# Patient Record
Sex: Male | Born: 1975
Health system: Southern US, Community
[De-identification: ages and names within clinical notes are randomized; demographics above are authoritative.]

## PROBLEM LIST (undated history)

## (undated) DIAGNOSIS — R55 Syncope and collapse: Secondary | ICD-10-CM

## (undated) HISTORY — DX: Syncope and collapse: R55

---

## 2010-11-10 ENCOUNTER — Other Ambulatory Visit: Payer: Self-pay | Admitting: Family Medicine

## 2010-11-10 DIAGNOSIS — N509 Disorder of male genital organs, unspecified: Secondary | ICD-10-CM

## 2010-11-11 ENCOUNTER — Ambulatory Visit
Admission: RE | Admit: 2010-11-11 | Discharge: 2010-11-11 | Disposition: A | Discharge status: Home / Self Care | Payer: BLUE CROSS/BLUE SHIELD | Source: Ambulatory Visit | Attending: Family Medicine | Admitting: Family Medicine

## 2010-11-11 ENCOUNTER — Other Ambulatory Visit: Payer: Self-pay | Admitting: Family Medicine

## 2010-11-11 DIAGNOSIS — N509 Disorder of male genital organs, unspecified: Secondary | ICD-10-CM

## 2012-02-23 ENCOUNTER — Ambulatory Visit (INDEPENDENT_AMBULATORY_CARE_PROVIDER_SITE_OTHER): Payer: BC Managed Care – PPO | Admitting: Family Medicine

## 2012-02-23 VITALS — BP 122/82 | HR 70 | Temp 98.3°F | Resp 16 | Ht 69.5 in | Wt 185.0 lb

## 2012-02-23 DIAGNOSIS — L255 Unspecified contact dermatitis due to plants, except food: Secondary | ICD-10-CM

## 2012-02-23 DIAGNOSIS — L237 Allergic contact dermatitis due to plants, except food: Secondary | ICD-10-CM

## 2012-02-23 MED ORDER — METHYLPREDNISOLONE ACETATE 80 MG/ML IJ SUSP
120.0000 mg | Freq: Once | INTRAMUSCULAR | Status: AC
  Administered 2012-02-23: 120 mg via INTRAMUSCULAR

## 2012-02-23 NOTE — Progress Notes (Signed)
[facility] [address], [place] [address] [phone]  Date:  02/23/2012   Name:  Roger York   DOB:  1975-08-29   MRN:  [id number]  PCP:  No primary provider on file.    Chief Complaint: Poison Ivy   History of Present Illness:  Roger York is a 36 y.o. very pleasant male patient who presents with the following:  He was doing some work outside on Saturday (today is Thursday)- he broke out in PI on his right arm.  He has tried some OTC spray.  He usually gets a shot of steroids when this happens and it gets rid of the problem.  He tends to have severe PI.  He otherwise feels well and is generally healthy.   There is no problem list on file for this patient.   No past medical history on file.  No past surgical history on file.  History  Substance Use Topics  . Smoking status: Never Smoker   . Smokeless tobacco: Not on file  . Alcohol Use: Not on file    No family history on file.  Allergies  Allergen Reactions  . Penicillins Rash    Medication list has been reviewed and updated.  No current outpatient prescriptions on file prior to visit.    Review of Systems:  As per HPI- otherwise negative.   Physical Examination: Filed Vitals:   02/23/12 1357  BP: 122/82  Pulse: 70  Temp: 98.3 F (36.8 C)  Resp: 16   Filed Vitals:   02/23/12 1357  Height: 5' 9.5" (1.765 m)  Weight: 185 lb (83.915 kg)   Body mass index is 26.93 kg/(m^2). Ideal Body Weight: Weight in (lb) to have BMI = 25: 171.4   GEN: WDWN, NAD, Non-toxic, A & O x 3 HEENT: Atraumatic, Normocephalic. Neck supple. No masses, No LAD. Ears and Nose: No external deformity. CV: RRR, No M/G/R. No JVD. No thrill. No extra heart sounds. PULM: CTA B, no wheezes, crackles, rhonchi. No retractions. No resp. distress. No accessory muscle use EXTR: No c/c/e NEURO Normal gait.  PSYCH: Normally interactive. Conversant. Not depressed or anxious appearing.  Calm demeanor.    Right forearm with severe rhus dermatitis and several intact small bullae.  There are a few scattered minor area of rhus derm on his left arm and right ankle  Assessment and Plan: 1. Poison ivy  methylPREDNISolone acetate (DEPO-MEDROL) injection 120 mg   Demetrion prefers IM steroid shot- will treat as above.   Let me know if not better-Sooner if worse.     Abbe Amsterdam, MD

## 2014-04-20 ENCOUNTER — Ambulatory Visit (INDEPENDENT_AMBULATORY_CARE_PROVIDER_SITE_OTHER): Payer: BC Managed Care – PPO | Admitting: Physician Assistant

## 2014-04-20 VITALS — BP 110/66 | HR 67 | Temp 98.0°F | Resp 16 | Ht 69.0 in | Wt 167.4 lb

## 2014-04-20 DIAGNOSIS — J329 Chronic sinusitis, unspecified: Secondary | ICD-10-CM

## 2014-04-20 MED ORDER — AZITHROMYCIN 250 MG PO TABS: ORAL_TABLET | ORAL | Status: DC

## 2014-04-20 MED ORDER — IPRATROPIUM BROMIDE 0.03 % NA SOLN: 2.0000 | Freq: Two times a day (BID) | NASAL | Status: DC

## 2014-04-20 MED ORDER — GUAIFENESIN ER 1200 MG PO TB12: 1.0000 | ORAL_TABLET | Freq: Two times a day (BID) | ORAL | Status: DC | PRN

## 2014-04-20 NOTE — Progress Notes (Signed)
Subjective:    Patient ID: Roger York, male    DOB: 1976/05/20, 38 y.o.   MRN: [id number]   PCP: No PCP Per Patient  Chief Complaint  Patient presents with  . Sinusitis    x 1 week    Medications, allergies, past medical history, surgical history, family history, social history and problem list reviewed and updated.  HPI  This 38 y.o. male presents for evaluation of sinus symptoms x 7+ days.  Symptoms began while he was in [place] for work.  He took some over-the-counter products there, but he's not really sure what they were.  He arrived home yesterday and feels worse. Facial pressure and pain, congestion, drainage, cough is non-productive.  No fever, chills, arthralgias, myalgias, rash. No GI/GU symptoms.    Review of Systems As above.    Objective:   Physical Exam  Vitals reviewed. Constitutional: He is oriented to person, place, and time. Vital signs are normal. He appears well-developed and well-nourished. He is active and cooperative. No distress.  BP 110/66  Pulse 67  Temp(Src) 98 F (36.7 C) (Oral)  Resp 16  Ht  (1.753 m)  Wt 167 lb 6 oz (75.921 kg)  BMI 24.71 kg/m2  SpO2 99%   HENT:  Head: Normocephalic and atraumatic.  Right Ear: Hearing, tympanic membrane, external ear and ear canal normal.  Left Ear: Hearing, external ear and ear canal normal. Tympanic membrane is injected. Tympanic membrane is not retracted and not bulging.  Nose: Mucosal edema and rhinorrhea present.  No foreign bodies. Right sinus exhibits no maxillary sinus tenderness and no frontal sinus tenderness. Left sinus exhibits no maxillary sinus tenderness and no frontal sinus tenderness.  Mouth/Throat: Uvula is midline, oropharynx is clear and moist and mucous membranes are normal. No uvula swelling. No oropharyngeal exudate.  Eyes: Conjunctivae, EOM and lids are normal. Pupils are equal, round, and reactive to light. Right eye exhibits no discharge. Left eye  exhibits no discharge. No scleral icterus.  Neck: Trachea normal, normal range of motion and full passive range of motion without pain. Neck supple. No mass and no thyromegaly present.  Cardiovascular: Normal rate, regular rhythm and normal heart sounds.   Pulmonary/Chest: Effort normal and breath sounds normal.  Lymphadenopathy:       Head (right side): No submandibular, no tonsillar, no preauricular, no posterior auricular and no occipital adenopathy present.       Head (left side): No submandibular, no tonsillar, no preauricular and no occipital adenopathy present.    He has no cervical adenopathy.       Right: No supraclavicular adenopathy present.       Left: No supraclavicular adenopathy present.  Neurological: He is alert and oriented to person, place, and time. He has normal strength. No cranial nerve deficit or sensory deficit.  Skin: Skin is warm, dry and intact. No rash noted.  Psychiatric: He has a normal mood and affect. His speech is normal and behavior is normal.          Assessment & Plan:  1. Sinusitis, unspecified chronicity, unspecified location Anticipatory guidance, supportive care. RTC if symptoms worsen/persist. - ipratropium (ATROVENT) 0.03 % nasal spray; Place 2 sprays into both nostrils 2 (two) times daily.  Dispense: 30 mL; Refill: 0 - Guaifenesin (MUCINEX MAXIMUM STRENGTH) 1200 MG TB12; Take 1 tablet (1,200 mg total) by mouth every 12 (twelve) hours as needed.  Dispense: 14 tablet; Refill: 1 - azithromycin (ZITHROMAX) 250 MG tablet; Take 2  tabs PO x 1 dose, then 1 tab PO QD x 4 days  Dispense: 6 tablet; Refill: 0   Fernande Bras, PA-C Physician Assistant-Certified [facility]

## 2014-04-20 NOTE — Patient Instructions (Signed)
Get plenty of rest and drink at least 64 ounces of water daily. 

## 2014-06-18 ENCOUNTER — Ambulatory Visit (INDEPENDENT_AMBULATORY_CARE_PROVIDER_SITE_OTHER): Payer: BC Managed Care – PPO | Admitting: Family Medicine

## 2014-06-18 ENCOUNTER — Ambulatory Visit (INDEPENDENT_AMBULATORY_CARE_PROVIDER_SITE_OTHER): Payer: BC Managed Care – PPO

## 2014-06-18 VITALS — BP 114/68 | HR 89 | Temp 99.6°F | Resp 18 | Ht 69.0 in | Wt 166.4 lb

## 2014-06-18 DIAGNOSIS — R05 Cough: Secondary | ICD-10-CM

## 2014-06-18 DIAGNOSIS — R509 Fever, unspecified: Secondary | ICD-10-CM

## 2014-06-18 DIAGNOSIS — M791 Myalgia, unspecified site: Secondary | ICD-10-CM

## 2014-06-18 DIAGNOSIS — R55 Syncope and collapse: Secondary | ICD-10-CM

## 2014-06-18 DIAGNOSIS — R059 Cough, unspecified: Secondary | ICD-10-CM

## 2014-06-18 DIAGNOSIS — J159 Unspecified bacterial pneumonia: Secondary | ICD-10-CM

## 2014-06-18 LAB — POCT INFLUENZA A/B
INFLUENZA A, POC: NEGATIVE
Influenza B, POC: NEGATIVE

## 2014-06-18 LAB — CBC
HCT: 44.7 % (ref 39.0–52.0)
Hemoglobin: 15.6 g/dL (ref 13.0–17.0)
MCH: 29.1 pg (ref 26.0–34.0)
MCHC: 34.9 g/dL (ref 30.0–36.0)
MCV: 83.2 fL (ref 78.0–100.0)
MPV: 9 fL — AB (ref 9.4–12.4)
Platelets: 132 10*3/uL — ABNORMAL LOW (ref 150–400)
RBC: 5.37 MIL/uL (ref 4.22–5.81)
RDW: 14.1 % (ref 11.5–15.5)
WBC: 8 10*3/uL (ref 4.0–10.5)

## 2014-06-18 MED ORDER — LEVOFLOXACIN 500 MG PO TABS: 500.0000 mg | ORAL_TABLET | Freq: Every day | ORAL | Status: DC

## 2014-06-18 NOTE — Progress Notes (Addendum)
Subjective:   Patient ID: Roger York, male    DOB: [birth year], 38 y.o.   MRN: [id number]   This chart was scribed for Trinna Post, MD by Milly Jakob, ED Scribe. The patient was seen in room 5. Patient's care was started at 3:10 PM.   HPI  HPI Comments: Roger York is a 38 y.o. male who [facility] complaining of fever, chills, cough, generalized body aches, and chest congestion onset 2 days ago, and acutely worsened last night after dinner. He reports a subjective fever which began 2 days ago, and a fever yesterday of 102.5 and max temp today of 102.7. He reports chills and shaking from the chills. He states that the worse part has been the generalized body aches. He reports taking Advil and Tylenol with some relief. He states that this morning he experienced a near syncopal event in the shower, but he was able to sit down and avoid loosing consciousness. He denies sore throat, dysuria, or history of bladder infections. He reports drinking fluids all day and urinating 3 times.   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 He denies being around any sick individuals. He did not have a flu shot this year. He denies any recent forigen travel, but reports that he visited [place] for work in September. He denies a history of anemia or other medical problems. He denies taking any regular medications.  He reports that his daughter had a mild case of pneumonia 1 month ago.   There are no active problems to display for this patient.  History reviewed. No pertinent past medical history. History reviewed. No pertinent past surgical history. Allergies  Allergen Reactions  . Penicillins Rash   Prior to Admission medications   Medication Sig Start Date End Date Taking? Authorizing Provider  acetaminophen (TYLENOL) 500 MG tablet Take 500 mg by mouth every 6 (six) hours as needed.   Yes Historical Provider, MD  ibuprofen  (ADVIL,MOTRIN) 200 MG tablet Take 200 mg by mouth every 6 (six) hours as needed.   Yes Historical Provider, MD  ipratropium (ATROVENT) 0.03 % nasal spray Place 2 sprays into both nostrils 2 (two) times daily. 04/20/14   Fernande Bras, PA-C   History   Social History  . Marital Status: Married    Spouse Name: Revonda Standard    Number of Children: 0  . Years of Education: N/A   Occupational History  . [employer]    Social History Main Topics  . Smoking status: Never Smoker   . Smokeless tobacco: Never Used  . Alcohol Use: 4.0 oz/week    8 drink(s) per week  . Drug Use: No  . Sexual Activity: Not on file   Other Topics Concern  . Not on file   Social History Narrative   Lives with his wife and their 3 children.   Review of Systems  Constitutional: Positive for fever, chills, diaphoresis and fatigue.  HENT: Positive for congestion. Negative for sore throat.   Respiratory: Positive for cough.    Objective:  Physical Exam  Constitutional: He is oriented to person, place, and time. He appears well-developed and well-nourished. No distress.  HENT:  Head: Normocephalic and atraumatic.  Eyes: Conjunctivae and EOM are normal. Pupils are equal, round, and reactive to light.  Neck: Neck supple. No tracheal deviation present.  Cardiovascular: Normal rate, regular rhythm and normal heart sounds.   No murmur heard.  Pulmonary/Chest: Effort normal. No respiratory distress. He has rhonchi (left lower lobe).  Abdominal: Soft. There is no tenderness.  Musculoskeletal: Normal range of motion.  Neurological: He is alert and oriented to person, place, and time.  Skin: Skin is warm. He is diaphoretic.  Psychiatric: He has a normal mood and affect. His behavior is normal.  Nursing note and vitals reviewed.  Filed Vitals:   06/18/14 1427  BP: 114/68  Pulse: 89  Temp: 99.6 F (37.6 C)  Resp: 18   Results for orders placed or performed in visit on 06/18/14  POCT Influenza A/B  Result Value Ref  Range   Influenza A, POC Negative    Influenza B, POC Negative     [facility] reading (PRIMARY) by  Dr. Neva SeatGreene: CXR: RLL infiltrate, possible LLL infiltrate - seen retrocardiac on lateral. .  CBC sent out stat as in house CBC out of order in office: Results for orders placed or performed in visit on 06/18/14  CBC  Result Value Ref Range   WBC 8.0 4.0 - 10.5 K/uL   RBC 5.37 4.22 - 5.81 MIL/uL   Hemoglobin 15.6 13.0 - 17.0 g/dL   HCT 16.144.7 09.639.0 - 04.552.0 %   MCV 83.2 78.0 - 100.0 fL   MCH 29.1 26.0 - 34.0 pg   MCHC 34.9 30.0 - 36.0 g/dL   RDW 40.914.1 81.111.5 - 91.415.5 %   Platelets 132 (L) 150 - 400 K/uL   MPV 9.0 (L) 9.4 - 12.4 fL  POCT Influenza A/B  Result Value Ref Range   Influenza A, POC Negative    Influenza B, POC Negative     Assessment & Plan:   Roger York is a 38 y.o. male Fever, unspecified fever cause , Cough, Myalgia - Plan: DG Chest 2 View, POCT Influenza A/B, CBC, CANCELED: POCT CBC  Pneumonia, bacterial - Plan: levofloxacin (LEVAQUIN) 500 MG tablet  Vasovagal near syncope  Concern for early CAP,  O2 sat ok, not in resp distress.  -started on Levaquin, antipyreteics for fever, rtc/er precautions and sx care. Plan for repeat eval in 2 days. Sooner if worse. Understanding expressed.   Meds ordered this encounter  Medications  . acetaminophen (TYLENOL) 500 MG tablet    Sig: Take 500 mg by mouth every 6 (six) hours as needed.  Marland Kitchen. ibuprofen (ADVIL,MOTRIN) 200 MG tablet    Sig: Take 200 mg by mouth every 6 (six) hours as needed.  Marland Kitchen. levofloxacin (LEVAQUIN) 500 MG tablet    Sig: Take 1 tablet (500 mg total) by mouth daily.    Dispense:  10 tablet    Refill:  0   Patient Instructions  Start Levaquin for pneumonia, recheck in next 2 days (Dr. Dareen PianoAnderson or Dr. Alwyn RenHopper). Increase fluids, small amounts frequently. If any further lighthheadedness or feeling of going to pass out - return here or emergency room.  Return to the clinic or go to the nearest emergency room if any of  your symptoms worsen or new symptoms occur.  Pneumonia Pneumonia is an infection of the lungs.  CAUSES Pneumonia may be caused by bacteria or a virus. Usually, these infections are caused by breathing infectious particles into the lungs (respiratory tract). SIGNS AND SYMPTOMS   Cough.  Fever.  Chest pain.  Increased rate of breathing.  Wheezing.  Mucus production. DIAGNOSIS  If you have the common symptoms of pneumonia, your health care provider will typically confirm the diagnosis with a chest X-ray. The X-ray will show an abnormality  in the lung (pulmonary infiltrate) if you have pneumonia. Other tests of your blood, urine, or sputum may be done to find the specific cause of your pneumonia. Your health care provider may also do tests (blood gases or pulse oximetry) to see how well your lungs are working. TREATMENT  Some forms of pneumonia may be spread to other people when you cough or sneeze. You may be asked to wear a mask before and during your exam. Pneumonia that is caused by bacteria is treated with antibiotic medicine. Pneumonia that is caused by the influenza virus may be treated with an antiviral medicine. Most other viral infections must run their course. These infections will not respond to antibiotics.  HOME CARE INSTRUCTIONS   Cough suppressants may be used if you are losing too much rest. However, coughing protects you by clearing your lungs. You should avoid using cough suppressants if you can.  Your health care provider may have prescribed medicine if he or she thinks your pneumonia is caused by bacteria or influenza. Finish your medicine even if you start to feel better.  Your health care provider may also prescribe an expectorant. This loosens the mucus to be coughed up.  Take medicines only as directed by your health care provider.  Do not smoke. Smoking is a common cause of bronchitis and can contribute to pneumonia. If you are a smoker and continue to smoke,  your cough may last several weeks after your pneumonia has cleared.  A cold steam vaporizer or humidifier in your room or home may help loosen mucus.  Coughing is often worse at night. Sleeping in a semi-upright position in a recliner or using a couple pillows under your head will help with this.  Get rest as you feel it is needed. Your body will usually let you know when you need to rest. PREVENTION A pneumococcal shot (vaccine) is available to prevent a common bacterial cause of pneumonia. This is usually suggested for:  People over 44 years old.  Patients on chemotherapy.  People with chronic lung problems, such as bronchitis or emphysema.  People with immune system problems. If you are over 65 or have a high risk condition, you may receive the pneumococcal vaccine if you have not received it before. In some countries, a routine influenza vaccine is also recommended. This vaccine can help prevent some cases of pneumonia.You may be offered the influenza vaccine as part of your care. If you smoke, it is time to quit. You may receive instructions on how to stop smoking. Your health care provider can provide medicines and counseling to help you quit. SEEK MEDICAL CARE IF: You have a fever. SEEK IMMEDIATE MEDICAL CARE IF:   Your illness becomes worse. This is especially true if you are elderly or weakened from any other disease.  You cannot control your cough with suppressants and are losing sleep.  You begin coughing up blood.  You develop pain which is getting worse or is uncontrolled with medicines.  Any of the symptoms which initially brought you in for treatment are getting worse rather than better.  You develop shortness of breath or chest pain. MAKE SURE YOU:   Understand these instructions.  Will watch your condition.  Will get help right away if you are not doing well or get worse. Document Released: 07/18/2005 Document Revised: 12/02/2013 Document Reviewed:  10/07/2010 [facility] Patient Information 2015 [place], [place]. This information is not intended to replace advice given to you by your health care provider. Make  sure you discuss any questions you have with your health care provider.     I personally performed the services described in this documentation, which was scribed in my presence. The recorded information has been reviewed and considered, and addended by me as needed.

## 2014-06-18 NOTE — Patient Instructions (Addendum)
Start Levaquin for pneumonia, recheck in next 2 days (Dr. Dareen PianoAnderson or Dr. Alwyn RenHopper). Increase fluids, small amounts frequently. If any further lighthheadedness or feeling of going to pass out - return here or emergency room.  Return to the clinic or go to the nearest emergency room if any of your symptoms worsen or new symptoms occur.  Pneumonia Pneumonia is an infection of the lungs.  CAUSES Pneumonia may be caused by bacteria or a virus. Usually, these infections are caused by breathing infectious particles into the lungs (respiratory tract). SIGNS AND SYMPTOMS   Cough.  Fever.  Chest pain.  Increased rate of breathing.  Wheezing.  Mucus production. DIAGNOSIS  If you have the common symptoms of pneumonia, your health care provider will typically confirm the diagnosis with a chest X-ray. The X-ray will show an abnormality in the lung (pulmonary infiltrate) if you have pneumonia. Other tests of your blood, urine, or sputum may be done to find the specific cause of your pneumonia. Your health care provider may also do tests (blood gases or pulse oximetry) to see how well your lungs are working. TREATMENT  Some forms of pneumonia may be spread to other people when you cough or sneeze. You may be asked to wear a mask before and during your exam. Pneumonia that is caused by bacteria is treated with antibiotic medicine. Pneumonia that is caused by the influenza virus may be treated with an antiviral medicine. Most other viral infections must run their course. These infections will not respond to antibiotics.  HOME CARE INSTRUCTIONS   Cough suppressants may be used if you are losing too much rest. However, coughing protects you by clearing your lungs. You should avoid using cough suppressants if you can.  Your health care provider may have prescribed medicine if he or she thinks your pneumonia is caused by bacteria or influenza. Finish your medicine even if you start to feel better.  Your  health care provider may also prescribe an expectorant. This loosens the mucus to be coughed up.  Take medicines only as directed by your health care provider.  Do not smoke. Smoking is a common cause of bronchitis and can contribute to pneumonia. If you are a smoker and continue to smoke, your cough may last several weeks after your pneumonia has cleared.  A cold steam vaporizer or humidifier in your room or home may help loosen mucus.  Coughing is often worse at night. Sleeping in a semi-upright position in a recliner or using a couple pillows under your head will help with this.  Get rest as you feel it is needed. Your body will usually let you know when you need to rest. PREVENTION A pneumococcal shot (vaccine) is available to prevent a common bacterial cause of pneumonia. This is usually suggested for:  People over 40 years old.  Patients on chemotherapy.  People with chronic lung problems, such as bronchitis or emphysema.  People with immune system problems. If you are over 65 or have a high risk condition, you may receive the pneumococcal vaccine if you have not received it before. In some countries, a routine influenza vaccine is also recommended. This vaccine can help prevent some cases of pneumonia.You may be offered the influenza vaccine as part of your care. If you smoke, it is time to quit. You may receive instructions on how to stop smoking. Your health care provider can provide medicines and counseling to help you quit. SEEK MEDICAL CARE IF: You have a fever. SEEK IMMEDIATE  MEDICAL CARE IF:   Your illness becomes worse. This is especially true if you are elderly or weakened from any other disease.  You cannot control your cough with suppressants and are losing sleep.  You begin coughing up blood.  You develop pain which is getting worse or is uncontrolled with medicines.  Any of the symptoms which initially brought you in for treatment are getting worse rather than  better.  You develop shortness of breath or chest pain. MAKE SURE YOU:   Understand these instructions.  Will watch your condition.  Will get help right away if you are not doing well or get worse. Document Released: 07/18/2005 Document Revised: 12/02/2013 Document Reviewed: 10/07/2010 [facility] Patient Information 2015 [place]. This information is not intended to replace advice given to you by your health care provider. Make sure you discuss any questions you have with your health care provider.

## 2014-06-20 ENCOUNTER — Ambulatory Visit (INDEPENDENT_AMBULATORY_CARE_PROVIDER_SITE_OTHER): Payer: BC Managed Care – PPO | Admitting: Internal Medicine

## 2014-06-20 VITALS — BP 118/76 | HR 84 | Temp 98.6°F | Resp 18 | Ht 68.25 in | Wt 162.8 lb

## 2014-06-20 DIAGNOSIS — R5383 Other fatigue: Secondary | ICD-10-CM

## 2014-06-20 DIAGNOSIS — D696 Thrombocytopenia, unspecified: Secondary | ICD-10-CM

## 2014-06-20 DIAGNOSIS — J159 Unspecified bacterial pneumonia: Secondary | ICD-10-CM

## 2014-06-20 DIAGNOSIS — R51 Headache: Secondary | ICD-10-CM

## 2014-06-20 DIAGNOSIS — R519 Headache, unspecified: Secondary | ICD-10-CM

## 2014-06-20 LAB — POCT CBC
Granulocyte percent: 64.8 %G (ref 37–80)
HEMATOCRIT: 45.3 % (ref 43.5–53.7)
Hemoglobin: 15.2 g/dL (ref 14.1–18.1)
Lymph, poc: 1.2 (ref 0.6–3.4)
MCH, POC: 28.4 pg (ref 27–31.2)
MCHC: 33.5 g/dL (ref 31.8–35.4)
MCV: 84.8 fL (ref 80–97)
MID (cbc): 0.4 (ref 0–0.9)
MPV: 7 fL (ref 0–99.8)
POC Granulocyte: 2.9 (ref 2–6.9)
POC LYMPH %: 26.9 % (ref 10–50)
POC MID %: 8.3 %M (ref 0–12)
Platelet Count, POC: 139 10*3/uL — AB (ref 142–424)
RBC: 5.34 M/uL (ref 4.69–6.13)
RDW, POC: 13.8 %
WBC: 4.4 10*3/uL — AB (ref 4.6–10.2)

## 2014-06-20 NOTE — Progress Notes (Signed)
   Subjective:    Patient ID: Roger York, male    DOB: 10/05/1975, 38 y.o.   MRN: [id number]  HPI 38 year old Caucasian male is here today as a follow up for Pneumonia, diagnosed on 06/18/2014. He states that he feels much better. He states he is coughing up more phlegm than before and beginning to have more energy. He states he still has some dizziness. Appetite is slowly coming back. He states that last night after going to sleep, he did wake up soaked , did not take his temperature , so he does not know for sure if he was running a fever.   Review of Systems     Objective:   Physical Exam  Constitutional: He is oriented to person, place, and time. He appears well-developed and well-nourished. No distress.  HENT:  Head: Normocephalic.  Right Ear: External ear normal.  Left Ear: External ear normal.  Nose: Mucosal edema and rhinorrhea present.  Mouth/Throat: Oropharynx is clear and moist.  Eyes: Conjunctivae and EOM are normal. Pupils are equal, round, and reactive to light. No scleral icterus.  Neck: Normal range of motion. Neck supple.  Cardiovascular: Normal rate, regular rhythm and normal heart sounds.   Pulmonary/Chest: Effort normal and breath sounds normal. No respiratory distress. He has no wheezes. He has no rales. He exhibits tenderness.  Musculoskeletal: Normal range of motion.  Neurological: He is alert and oriented to person, place, and time. He has normal reflexes. No cranial nerve deficit. He exhibits normal muscle tone. Coordination normal.  Psychiatric: He has a normal mood and affect.     Results for orders placed or performed in visit on 06/20/14  POCT CBC  Result Value Ref Range   WBC 4.4 (A) 4.6 - 10.2 K/uL   Lymph, poc 1.2 0.6 - 3.4   POC LYMPH PERCENT 26.9 10 - 50 %L   MID (cbc) 0.4 0 - 0.9   POC MID % 8.3 0 - 12 %M   POC Granulocyte 2.9 2 - 6.9   Granulocyte percent 64.8 37 - 80 %G   RBC 5.34 4.69 - 6.13 M/uL   Hemoglobin 15.2 14.1 - 18.1 g/dL   HCT, POC 40.945.3 81.143.5 - 53.7 %   MCV 84.8 80 - 97 fL   MCH, POC 28.4 27 - 31.2 pg   MCHC 33.5 31.8 - 35.4 g/dL   RDW, POC 91.413.8 %   Platelet Count, POC 139 (A) 142 - 424 K/uL   MPV 7.0 0 - 99.8 fL        Assessment & Plan:

## 2014-06-20 NOTE — Patient Instructions (Signed)

## 2016-09-13 DIAGNOSIS — Z23 Encounter for immunization: Secondary | ICD-10-CM | POA: Diagnosis not present

## 2017-08-03 DIAGNOSIS — Z23 Encounter for immunization: Secondary | ICD-10-CM | POA: Diagnosis not present

## 2018-01-25 ENCOUNTER — Emergency Department (HOSPITAL_COMMUNITY)
Admission: EM | Admit: 2018-01-25 | Discharge: 2018-01-25 | Disposition: A | Discharge status: Home / Self Care | Payer: BLUE CROSS/BLUE SHIELD | Attending: Emergency Medicine | Admitting: Emergency Medicine

## 2018-01-25 ENCOUNTER — Emergency Department (HOSPITAL_COMMUNITY): Payer: BLUE CROSS/BLUE SHIELD

## 2018-01-25 ENCOUNTER — Encounter (HOSPITAL_COMMUNITY): Payer: Self-pay | Admitting: Emergency Medicine

## 2018-01-25 DIAGNOSIS — R531 Weakness: Secondary | ICD-10-CM | POA: Diagnosis not present

## 2018-01-25 DIAGNOSIS — H538 Other visual disturbances: Secondary | ICD-10-CM | POA: Insufficient documentation

## 2018-01-25 DIAGNOSIS — Z79899 Other long term (current) drug therapy: Secondary | ICD-10-CM | POA: Insufficient documentation

## 2018-01-25 DIAGNOSIS — R5383 Other fatigue: Secondary | ICD-10-CM | POA: Diagnosis not present

## 2018-01-25 DIAGNOSIS — R42 Dizziness and giddiness: Secondary | ICD-10-CM | POA: Insufficient documentation

## 2018-01-25 DIAGNOSIS — R51 Headache: Secondary | ICD-10-CM | POA: Diagnosis not present

## 2018-01-25 DIAGNOSIS — E860 Dehydration: Secondary | ICD-10-CM | POA: Diagnosis not present

## 2018-01-25 LAB — BASIC METABOLIC PANEL
ANION GAP: 8 (ref 5–15)
BUN: 13 mg/dL (ref 6–20)
CHLORIDE: 104 mmol/L (ref 98–111)
CO2: 28 mmol/L (ref 22–32)
CREATININE: 1.15 mg/dL (ref 0.61–1.24)
Calcium: 9.3 mg/dL (ref 8.9–10.3)
GFR calc non Af Amer: >60 mL/min (ref >60)
Glucose, Bld: 110 mg/dL — ABNORMAL HIGH (ref 70–99)
POTASSIUM: 4.3 mmol/L (ref 3.5–5.1)
SODIUM: 140 mmol/L (ref 135–145)

## 2018-01-25 LAB — CBC
HEMATOCRIT: 47.1 % (ref 39.0–52.0)
HEMOGLOBIN: 15.7 g/dL (ref 13.0–17.0)
MCH: 29.3 pg (ref 26.0–34.0)
MCHC: 33.3 g/dL (ref 30.0–36.0)
MCV: 88 fL (ref 78.0–100.0)
Platelets: 172 10*3/uL (ref 150–400)
RBC: 5.35 MIL/uL (ref 4.22–5.81)
RDW: 13 % (ref 11.5–15.5)
WBC: 4 10*3/uL (ref 4.0–10.5)

## 2018-01-25 LAB — TROPONIN I

## 2018-01-25 MED ORDER — SODIUM CHLORIDE 0.9 % IV BOLUS
1000.0000 mL | Freq: Once | INTRAVENOUS | Status: AC
  Administered 2018-01-25: 1000 mL via INTRAVENOUS

## 2018-01-25 NOTE — ED Notes (Signed)
Pt ambulated w/o difficulty

## 2018-01-25 NOTE — ED Provider Notes (Signed)
[facility]-EMERGENCY DEPT Provider Note   CSN: [id number] Arrival date & time: 01/25/18  0759     History   Chief Complaint Chief Complaint  Patient presents with  . Headache  . Fatigue  . Dizziness    HPI Roger York is a 42 y.o. male.  HPI  42 year old male with no significant past medical history here with lightheadedness and blurred vision.  Patient states he was in his usual state of health.  He went outside to play basketball with his son yesterday.  He states that after only 15 minutes of playing, began to feel lightheaded.  He had intermittent blurry vision that felt like a throbbing sensation along with a sensation that he was going to pass out.  He denies any chest pain or shortness of breath.  No nausea or vomiting.  No focal weakness or numbness.  He went inside and laid down and his symptoms gradually improved.  However, upon awakening this morning, he has had persistent intermittent blurred vision and a sensation of feeling very weak.  No nausea or vomiting.  No abdominal pain.  No vision changes. No CP since then. No med use. No OTC med use other than fish oil. Family does have a h/o early CAD in grandfather.   History reviewed. No pertinent past medical history.  There are no active problems to display for this patient.   History reviewed. No pertinent surgical history.      Home Medications    Prior to Admission medications   Medication Sig Start Date End Date Taking? Authorizing Provider  Omega-3 Fatty Acids (FISH OIL PO) Take 1 tablet by mouth daily.   Yes [provider]  ipratropium (ATROVENT) 0.03 % nasal spray Place 2 sprays into both nostrils 2 (two) times daily. Patient not taking: Reported on 01/25/2018 04/20/14   Porfirio OarJeffery, Chelle, PA-C  levofloxacin (LEVAQUIN) 500 MG tablet Take 1 tablet (500 mg total) by mouth daily. Patient not taking: Reported on 01/25/2018 06/18/14   Shade FloodGreene, Jeffrey R, MD    Family  History Family History  Problem Relation Age of Onset  . Muscular dystrophy Son     Social History Social History   Tobacco Use  . Smoking status: Never Smoker  . Smokeless tobacco: Never Used  Substance Use Topics  . Alcohol use: Yes    Alcohol/week: 4.8 oz    Types: 8 drink(s) per week  . Drug use: No     Allergies   Penicillins   Review of Systems Review of Systems  Constitutional: Positive for fatigue. Negative for chills and fever.  HENT: Negative for congestion and rhinorrhea.   Eyes: Negative for visual disturbance.  Respiratory: Negative for cough, shortness of breath and wheezing.   Cardiovascular: Negative for chest pain and leg swelling.  Gastrointestinal: Positive for nausea. Negative for abdominal pain, diarrhea and vomiting.  Genitourinary: Negative for dysuria and flank pain.  Musculoskeletal: Negative for neck pain and neck stiffness.  Skin: Negative for rash and wound.  Allergic/Immunologic: Negative for immunocompromised state.  Neurological: Positive for weakness and headaches. Negative for syncope.  All other systems reviewed and are negative.    Physical Exam Updated Vital Signs BP 123/79 (BP Location: Right Arm)   Pulse (!) 56   Temp 97.6 F (36.4 C) (Oral)   Resp 16   Ht 5\' 10"  (1.778 m)   Wt 81.6 kg (180 lb)   SpO2 99%   BMI 25.83 kg/m   Physical Exam  Constitutional:  He is oriented to person, place, and time. He appears well-developed and well-nourished. No distress.  HENT:  Head: Normocephalic and atraumatic.  Eyes: Conjunctivae are normal.  Neck: Neck supple.  Cardiovascular: Normal rate, regular rhythm and normal heart sounds. Exam reveals no friction rub.  No murmur heard. Pulmonary/Chest: Effort normal and breath sounds normal. No respiratory distress. He has no wheezes. He has no rales.  Abdominal: He exhibits no distension.  Musculoskeletal: He exhibits no edema.  Neurological: He is alert and oriented to person, place,  and time. He has normal strength. No cranial nerve deficit or sensory deficit. He exhibits normal muscle tone. GCS eye subscore is 4. GCS verbal subscore is 5. GCS motor subscore is 6.  Skin: Skin is warm. Capillary refill takes less than 2 seconds.  Psychiatric: He has a normal mood and affect.  Nursing note and vitals reviewed.    ED Treatments / Results  Labs (all labs ordered are listed, but only abnormal results are displayed) Labs Reviewed  BASIC METABOLIC PANEL - Abnormal; Notable for the following components:      Result Value   Glucose, Bld 110 (*)    All other components within normal limits  CBC  TROPONIN I    EKG EKG Interpretation  Date/Time:  Thursday January 25 2018 08:07:18 EDT Ventricular Rate:  58 PR Interval:    QRS Duration: 111 QT Interval:  389 QTC Calculation: 382 R Axis:   -46 Text Interpretation:  Sinus rhythm Incomplete RBBB and LAFB RSR' in V1 or V2, right VCD or RVH No old tracing to compare Reconfirmed by Shaune Pollack [phone]) on 01/25/2018 8:23:36 AM   Radiology Dg Chest 2 View  Result Date: 01/25/2018 CLINICAL DATA:  Headache. EXAM: CHEST - 2 VIEW COMPARISON:  June 18, 2014 FINDINGS: The heart size and mediastinal contours are within normal limits. Both lungs are clear. The visualized skeletal structures are unremarkable. IMPRESSION: No active cardiopulmonary disease. Electronically Signed   By: Gerome Sam III M.D   On: 01/25/2018 09:35   Ct Head Wo Contrast  Result Date: 01/25/2018 CLINICAL DATA:  Headache began last night while playing basketball with son, dizziness, nausea, weakness EXAM: CT HEAD WITHOUT CONTRAST TECHNIQUE: Contiguous axial images were obtained from the base of the skull through the vertex without intravenous contrast. COMPARISON:  None. FINDINGS: Brain: The ventricular system is normal in size and configuration and the septum is in a normal midline position. The fourth ventricle and basilar cisterns are unremarkable. No  hemorrhage, mass lesion, or acute infarction is seen. Vascular: No vascular abnormality is noted on this unenhanced study. Skull: On bone window images, no calvarial abnormality is seen. Sinuses/Orbits: The paranasal sinuses are well pneumatized. Other: None. IMPRESSION: Negative unenhanced CT of the brain. Electronically Signed   By: Dwyane Dee M.D.   On: 01/25/2018 10:08    Procedures Procedures (including critical care time)  Medications Ordered in ED Medications  sodium chloride 0.9 % bolus 1,000 mL (1,000 mLs Intravenous New Bag/Given 01/25/18 0900)     Initial Impression / Assessment and Plan / ED Course  I have reviewed the triage vital signs and the nursing notes.  Pertinent labs & imaging results that were available during my care of the patient were reviewed by me and considered in my medical decision making (see chart for details).    42 year old male here with transient episode of fatigue and transient blurred vision while playing basketball yesterday.  EKG here is nonischemic.  Troponin negative despite  symptoms there yesterday with no active chest pain I do not suspect ACS or ischemic etiology.  He is not tachycardic, hypoxic, and I see no signs of PE.  No arrhythmia noted on telemetry.  Electrolytes otherwise within normal limits.  He has a nonfocal neurological examination and CT head is negative.  Chest x-ray without cardiomegaly or other abnormality.  I suspect this was secondary to dehydration and heat exposure while playing basketball outside and greater than 90 degrees heat.  He is been given fluids and feels better here.  He is able to ambulate without difficulty.  She does report some symptoms that could be consistent with transient palpitations so will refer him back to his PCP for possible outpatient monitoring, but no apparent emergent pathology identified at this time.  Final Clinical Impressions(s) / ED Diagnoses   Final diagnoses:  Dehydration  Blurred vision    Other fatigue    ED Discharge Orders    None       Shaune Pollack, MD 01/25/18 1038

## 2018-01-25 NOTE — ED Triage Notes (Signed)
Pt reports felt fine all day yesterday until after dinner when he went outside to play basketball with son. Reports while playing got headache, vision got "pulsing", dizzy, nausea and very weak, so went inside. Reports this morning still having headache and fatigue, with little dizziness. Denies n/v today.

## 2018-01-25 NOTE — Discharge Instructions (Addendum)
As we discussed, drink at least 6 to 8 glasses of fluid daily.  For now, I would stick to G2 or other low sugar electrolyte replacement drink.  If the metallic taste continues, I would recommend at home water testing.  For your symptoms, try to avoid any excessive exercise or exertion until you see a primary care doctor.  Drink fluids as above.  You may benefit from an outpatient Holter monitor to evaluate for possible underlying arrhythmia.

## 2018-01-31 DIAGNOSIS — H538 Other visual disturbances: Secondary | ICD-10-CM | POA: Diagnosis not present

## 2018-01-31 DIAGNOSIS — R51 Headache: Secondary | ICD-10-CM | POA: Diagnosis not present

## 2018-01-31 DIAGNOSIS — R42 Dizziness and giddiness: Secondary | ICD-10-CM | POA: Diagnosis not present

## 2018-01-31 DIAGNOSIS — M6281 Muscle weakness (generalized): Secondary | ICD-10-CM | POA: Diagnosis not present

## 2018-02-09 ENCOUNTER — Other Ambulatory Visit: Payer: Self-pay | Admitting: Physician Assistant

## 2018-02-09 ENCOUNTER — Ambulatory Visit
Admission: RE | Admit: 2018-02-09 | Discharge: 2018-02-09 | Disposition: A | Discharge status: Home / Self Care | Payer: BLUE CROSS/BLUE SHIELD | Source: Ambulatory Visit | Attending: Physician Assistant | Admitting: Physician Assistant

## 2018-02-09 DIAGNOSIS — M542 Cervicalgia: Secondary | ICD-10-CM | POA: Diagnosis not present

## 2018-02-09 DIAGNOSIS — M50322 Other cervical disc degeneration at C5-C6 level: Secondary | ICD-10-CM | POA: Diagnosis not present

## 2018-03-12 DIAGNOSIS — Z113 Encounter for screening for infections with a predominantly sexual mode of transmission: Secondary | ICD-10-CM | POA: Diagnosis not present

## 2018-09-20 DIAGNOSIS — J111 Influenza due to unidentified influenza virus with other respiratory manifestations: Secondary | ICD-10-CM | POA: Diagnosis not present

## 2018-10-12 DIAGNOSIS — J0100 Acute maxillary sinusitis, unspecified: Secondary | ICD-10-CM | POA: Diagnosis not present

## 2019-04-12 ENCOUNTER — Other Ambulatory Visit: Payer: Self-pay | Admitting: Family Medicine

## 2019-04-12 ENCOUNTER — Ambulatory Visit
Admission: RE | Admit: 2019-04-12 | Discharge: 2019-04-12 | Disposition: A | Discharge status: Home / Self Care | Payer: BLUE CROSS/BLUE SHIELD | Source: Ambulatory Visit | Attending: Family Medicine | Admitting: Family Medicine

## 2019-04-12 ENCOUNTER — Other Ambulatory Visit: Payer: Self-pay

## 2019-04-12 DIAGNOSIS — M546 Pain in thoracic spine: Secondary | ICD-10-CM | POA: Diagnosis not present

## 2019-04-12 DIAGNOSIS — M79601 Pain in right arm: Secondary | ICD-10-CM | POA: Diagnosis not present

## 2019-04-29 DIAGNOSIS — M79601 Pain in right arm: Secondary | ICD-10-CM | POA: Diagnosis not present

## 2019-05-01 DIAGNOSIS — M67823 Other specified disorders of tendon, right elbow: Secondary | ICD-10-CM | POA: Diagnosis not present

## 2019-09-09 ENCOUNTER — Ambulatory Visit: Payer: Self-pay | Attending: Internal Medicine

## 2019-09-09 DIAGNOSIS — Z23 Encounter for immunization: Secondary | ICD-10-CM

## 2019-09-09 NOTE — Progress Notes (Signed)
   Covid-19 Vaccination Clinic  Name:  Roger York    MRN: [id number] DOB: 1975-10-27  09/09/2019  [name] was observed post Covid-19 immunization for 15 minutes without incidence. He was provided with Vaccine Information Sheet and instruction to access the V-Safe system.   [name] was instructed to call 911 with any severe reactions post vaccine: Marland Kitchen Difficulty breathing  . Swelling of your face and throat  . A fast heartbeat  . A bad rash all over your body  . Dizziness and weakness    Immunizations Administered    Name Date Dose VIS Date Route   Pfizer COVID-19 Vaccine 09/09/2019  6:37 PM 0.3 mL 07/12/2019 Intramuscular   Manufacturer: [employer], [employer]   Lot: [id number]   NDC: [address]-6

## 2019-10-05 ENCOUNTER — Ambulatory Visit: Payer: Self-pay | Attending: Internal Medicine

## 2019-10-05 DIAGNOSIS — Z23 Encounter for immunization: Secondary | ICD-10-CM | POA: Insufficient documentation

## 2019-10-05 NOTE — Progress Notes (Signed)
   Covid-19 Vaccination Clinic  Name:  Roger York    MRN: [id number] DOB: [birth year]  10/05/2019  [name] was observed post Covid-19 immunization for 15 minutes without incident. He was provided with Vaccine Information Sheet and instruction to access the V-Safe system.   [name] was instructed to call 911 with any severe reactions post vaccine: Marland Kitchen Difficulty breathing  . Swelling of face and throat  . A fast heartbeat  . A bad rash all over body  . Dizziness and weakness   Immunizations Administered    Name Date Dose VIS Date Route   Pfizer COVID-19 Vaccine 10/05/2019  8:55 AM 0.3 mL 07/12/2019 Intramuscular   Manufacturer: [employer], [employer]   Lot: [id number]   NDC: [address]-4

## 2021-03-02 IMAGING — CR DG THORACIC SPINE 3V
3 series · 3 of 3 positions shown · non-contrast
Comparison: None.

CLINICAL DATA: Thoracic spine pain

EXAM:
THORACIC SPINE - 3 VIEWS

[t thoracic spine ap]
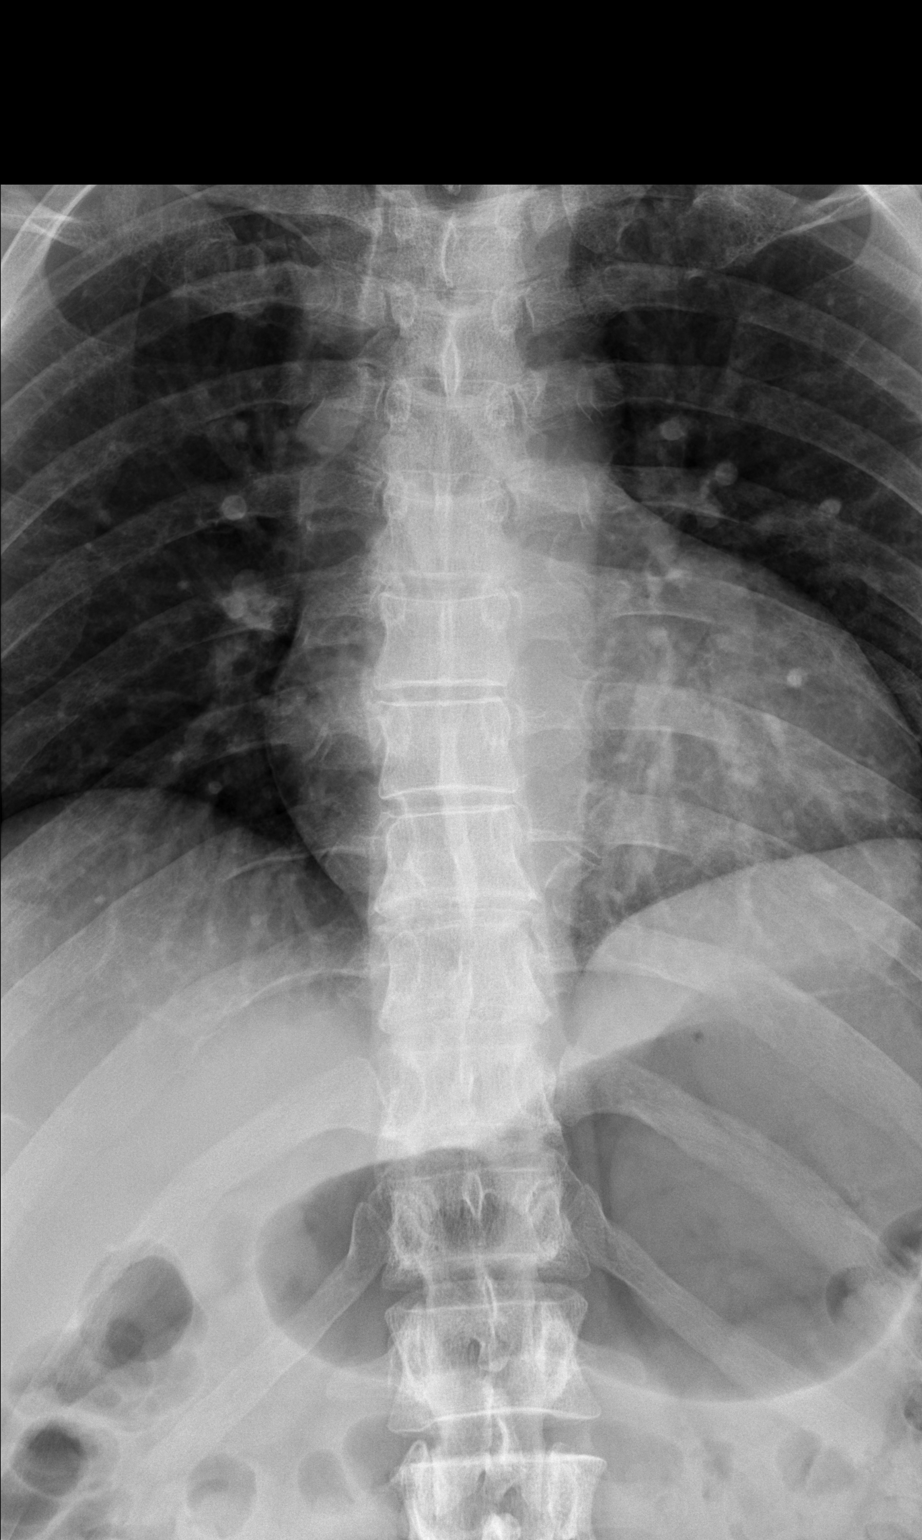

[t thoracic spine lat]
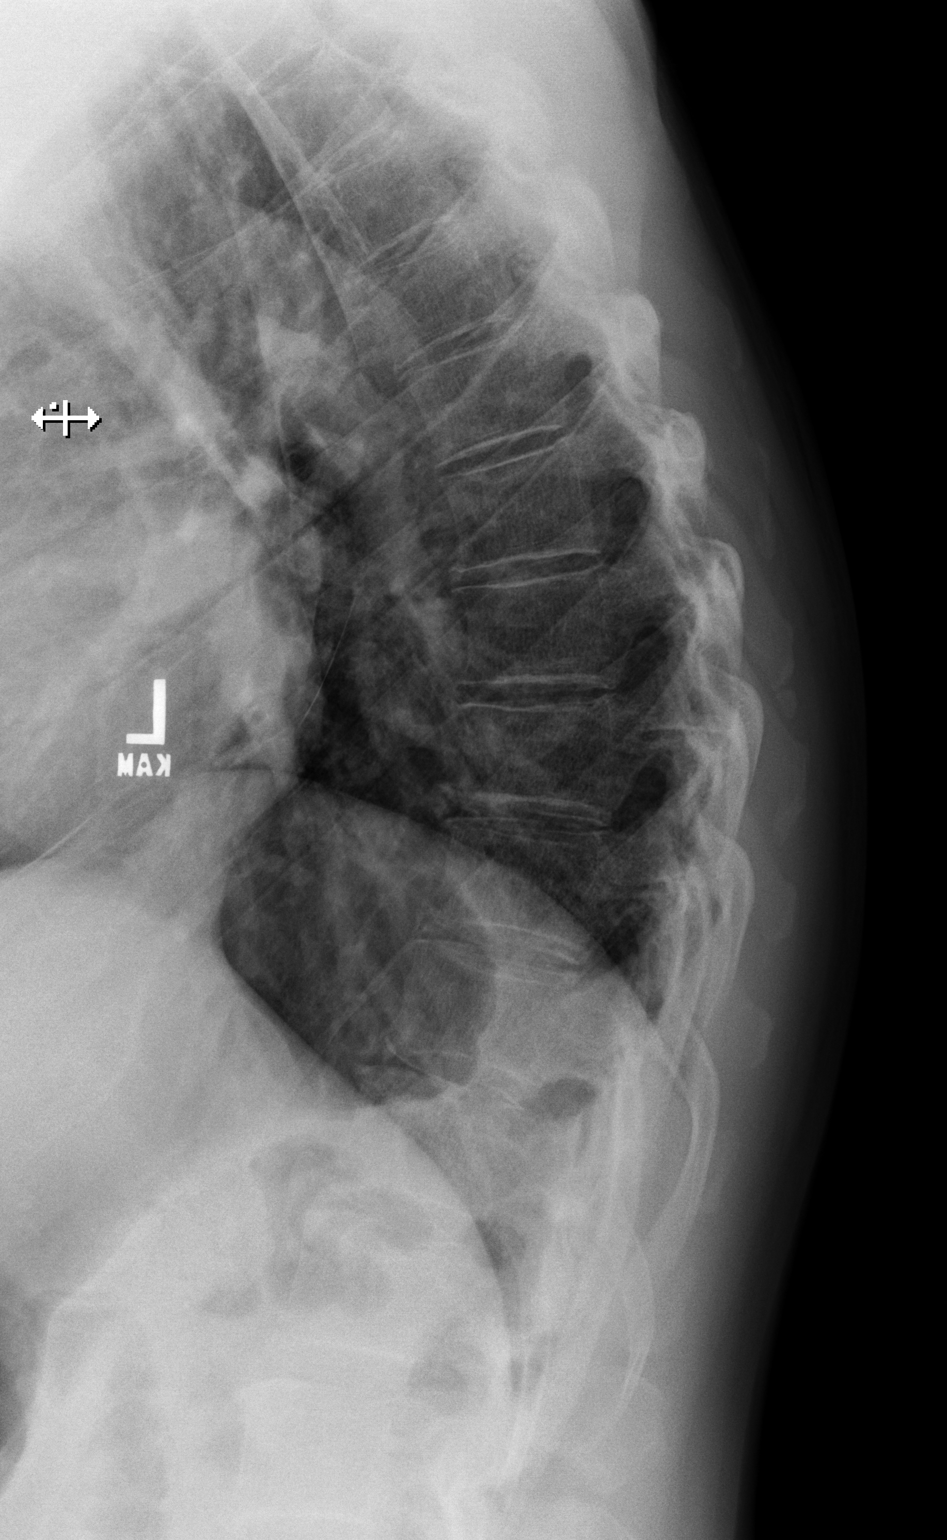

[t thoracic swimmers]
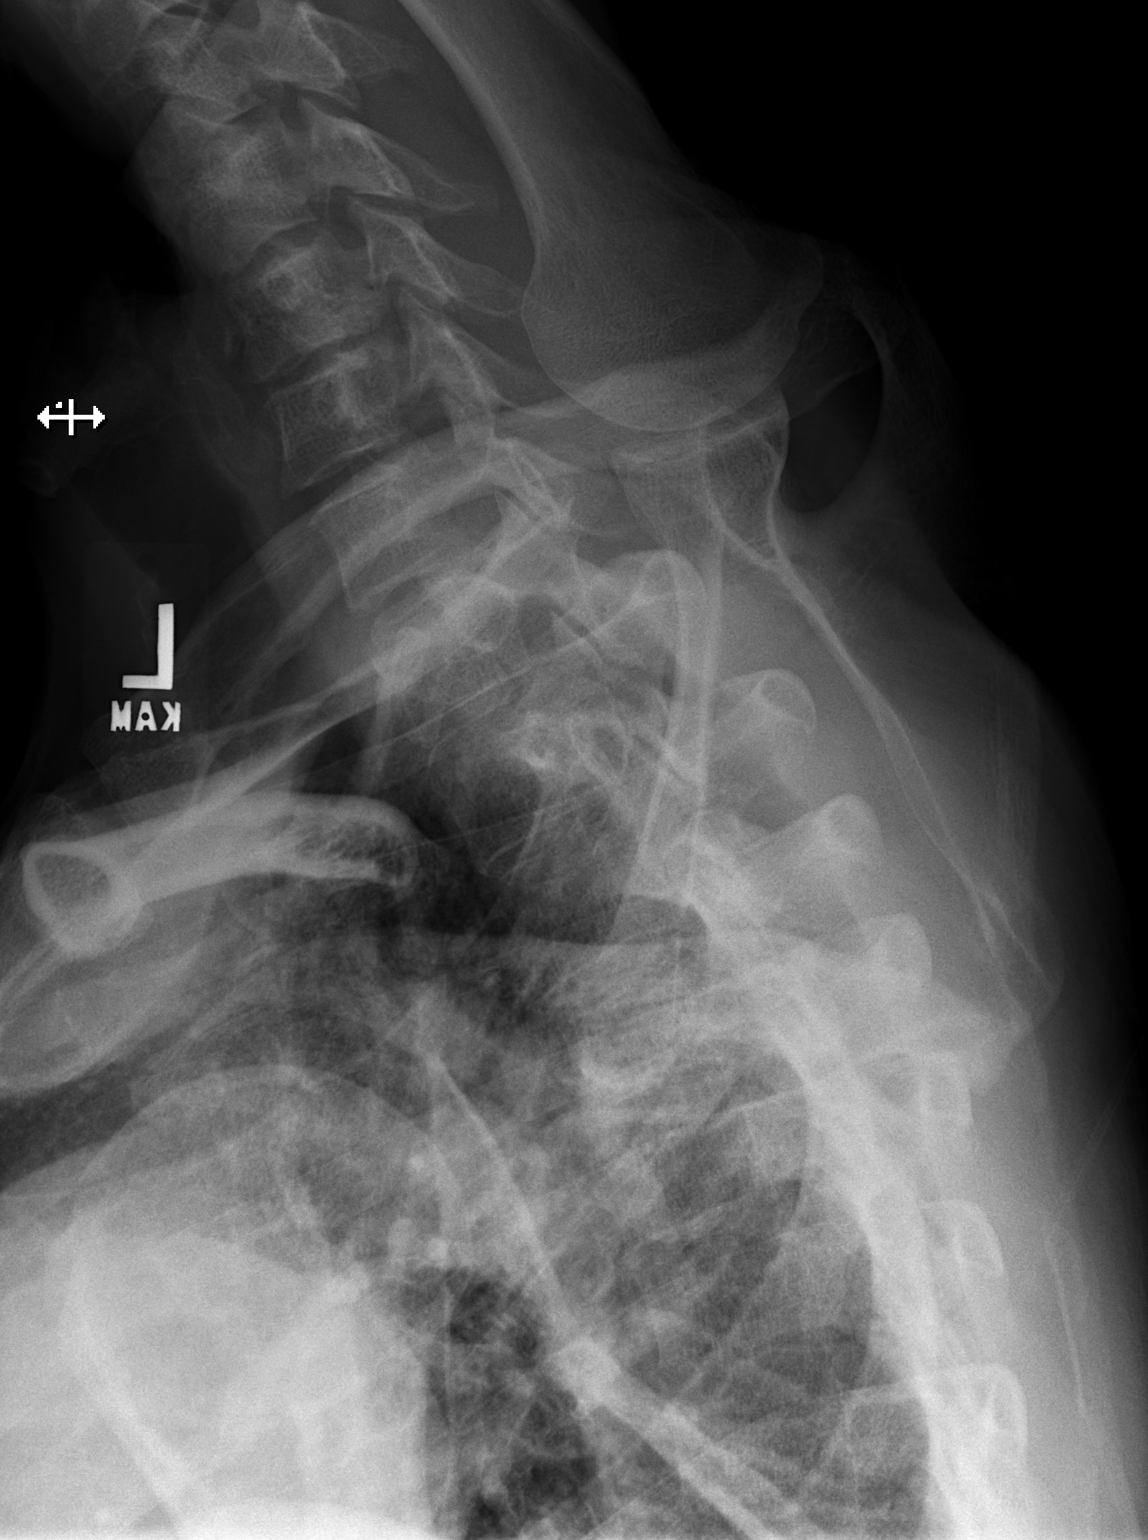

[3 of 3 positions shown; findings below may reference images not displayed]

FINDINGS: There is no evidence of thoracic spine fracture. Minimal rightward
curvature of the thoracic spine. No other significant bone
abnormalities are identified.
IMPRESSION: Minimal rightward curvature of the thoracic spine. Otherwise
negative

## 2021-08-03 ENCOUNTER — Encounter: Payer: Self-pay | Admitting: Internal Medicine

## 2021-08-03 ENCOUNTER — Ambulatory Visit: Payer: Managed Care, Other (non HMO) | Admitting: Internal Medicine

## 2021-08-03 ENCOUNTER — Other Ambulatory Visit: Payer: Self-pay

## 2021-08-03 VITALS — BP 110/68 | HR 56 | Ht 70.0 in | Wt 177.6 lb

## 2021-08-03 DIAGNOSIS — R9431 Abnormal electrocardiogram [ECG] [EKG]: Secondary | ICD-10-CM | POA: Diagnosis not present

## 2021-08-03 DIAGNOSIS — R55 Syncope and collapse: Secondary | ICD-10-CM | POA: Diagnosis not present

## 2021-08-03 NOTE — Patient Instructions (Signed)
Medication Instructions:  Your physician recommends that you continue on your current medications as directed. Please refer to the Current Medication list given to you today.  *If you need a refill on your cardiac medications before your next appointment, please call your pharmacy*   Testing/Procedures: Your physician has requested that you have an echocardiogram. Echocardiography is a painless test that uses sound waves to create images of your heart. It provides your doctor with information about the size and shape of your heart and how well your hearts chambers and valves are working. This procedure takes approximately one hour. There are no restrictions for this procedure. -- [address] [employer]   Follow-Up: At [facility], you and your health needs are our priority.  As part of our continuing mission to provide you with exceptional heart care, we have created designated Provider Care Teams.  These Care Teams include your primary Cardiologist (physician) and Advanced Practice Providers (APPs -  Physician Assistants and Nurse Practitioners) who all work together to provide you with the care you need, when you need it.  We recommend signing up for the patient portal called "MyChart".  Sign up information is provided on this After Visit Summary.  MyChart is used to connect with patients for Virtual Visits (Telemedicine).  Patients are able to view lab/test results, encounter notes, upcoming appointments, etc.  Non-urgent messages can be sent to your provider as well.   To learn more about what you can do with MyChart, go to [email or web address].    Your next appointment:   AS NEEDED with Dr. Debara Pickett

## 2021-08-03 NOTE — Progress Notes (Signed)
OFFICE CONSULT NOTE  Chief Complaint:  Syncope  Primary Care Physician: [facility] [place]  HPI:  Roger York is a 46 y.o. male who is being seen today for the evaluation of syncope at the request of Scifres, Nicole Cella, [place].  This is a pleasant 46 year old male kindly referred for evaluation of syncope.  Recently had an episode about 2 months ago where he had syncope.  He said he awoke early in the morning to urinate.  He got up and went to the bathroom and was feeling a little dizzy.  Right as he started to urinate then he became very dizzy and passed out.  He struck his head.   He was evaluated after the words.  He has had these episodes previously in fact his first episode dates back to when he was in fifth grade when he was apparently learning about anatomy and felt queasy and then passed out.  He has had other episodes as well including 1 in 2019 when he was playing basketball with his son and nearly passed out.  He was seen in the emergency department for this and work-up was negative.  He is always had a prodrome prior to these events and the description is very classic for vasovagal syncope.  He was noted to have an slightly abnormal EKG in the past with incomplete right bundle branch block and left anterior fascicular block.  EKG today shows sinus bradycardia with only left anterior fascicular block in the 50s.  No family history of conduction disease.  No sudden cardiac death in early family members.  He has no known medical problems and takes no medications.  PMHx:  Past Medical History:  Diagnosis Date   Syncope     No past surgical history on file.  FAMHx:  Family History  Problem Relation Age of Onset   Muscular dystrophy Son     SOCHx:   reports that he has never smoked. He has never used smokeless tobacco. He reports current alcohol use of about 8.0 standard drinks per week. He reports that he does not use drugs.  ALLERGIES:  Allergies   Allergen Reactions   Penicillins Rash    Has patient had a PCN reaction causing immediate rash, facial/tongue/throat swelling, SOB or lightheadedness with hypotension:/Unknown  patient had a PCN reaction causing severe rash involving mucus membranes or skin necrosisUnknown Has patient had a PCN reaction that required hospitalization:Unknown Has patient had a PCN reaction occurring within the last 10 years:/Unknown If all of the above answers are "NO", then may proceed with Cephalosporin use.     ROS: Pertinent items noted in HPI and remainder of comprehensive ROS otherwise negative.  HOME MEDS: Current Outpatient Medications on File Prior to Visit  Medication Sig Dispense Refill   naproxen (NAPROSYN) 500 MG tablet 1 tablet with food or milk as needed     ipratropium (ATROVENT) 0.03 % nasal spray Place 2 sprays into both nostrils 2 (two) times daily. (Patient not taking: Reported on 01/25/2018) 30 mL 0   levofloxacin (LEVAQUIN) 500 MG tablet Take 1 tablet (500 mg total) by mouth daily. (Patient not taking: Reported on 01/25/2018) 10 tablet 0   Omega-3 Fatty Acids (FISH OIL PO) Take 1 tablet by mouth daily.     No current facility-administered medications on file prior to visit.    LABS/IMAGING: No results found for this or any previous visit (from the past 48 hour(s)). No results found.  LIPID PANEL: No results found  for: CHOL, TRIG, HDL, CHOLHDL, VLDL, LDLCALC, LDLDIRECT  WEIGHTS: Wt Readings from Last 3 Encounters:  08/03/21 177 lb 9.6 oz (80.6 kg)  01/25/18 180 lb (81.6 kg)  06/20/14 162 lb 12.8 oz (73.8 kg)    VITALS: BP 110/68    Pulse (!) 56    Ht 5\' 10"  (1.778 m)    Wt 177 lb 9.6 oz (80.6 kg)    SpO2 99%    BMI 25.48 kg/m   EXAM: General appearance: alert and no distress Neck: no carotid bruit, no JVD, and thyroid not enlarged, symmetric, no tenderness/mass/nodules Lungs: clear to auscultation bilaterally Heart: regular rate and rhythm, S1, S2 normal, no murmur,  click, rub or gallop Abdomen: soft, non-tender; bowel sounds normal; no masses,  no organomegaly Extremities: extremities normal, atraumatic, no cyanosis or edema Pulses: 2+ and symmetric Skin: Skin color, texture, turgor normal. No rashes or lesions Neurologic: Grossly normal Psych: Pleasant  EKG: Sinus bradycardia at 56 with left anterior fascicular block- personally reviewed  ASSESSMENT: Vasovagal syncope Abnormal EKG   PLAN: 1.   Roger York is describing classic vasovagal syncope having had at least 3 or more episodes dating back to the fifth grade.  All of the episodes were notable for prodrome and the most recent episode is classic for micturition syncope.  Although he has mild abnormalities on his EKG, I do not think that this is related to arrhythmia.  We talked about ways to try to prevent syncope in the future including adequate hydration, avoiding vasodilators and particularly moderation of the use of sildenafil which she has on prescription for.  If he feels a prodrome that he may pass out he should lie down immediately.  Ultimately if he has more frequent episodes he may benefit from lower extremity compression stockings or abdominal binders.  Fortunately, his episodes are very infrequent.  I recommended an echo just because of the abnormal EKG however if this looks structurally normal, no further work-up is necessary.  Plan is just to avoid triggers in the future.  Thanks again for the kind referral.  Again if the echo is normal, follow-up as needed.  Roger Lukes, MD, [facility], FACP  [place]   [facility]  Medical Director of the [facility] Lipid Disorders &  Cardiovascular Risk Reduction Clinic Diplomate of the American Board of Clinical Lipidology Attending Cardiologist  Direct Dial: [phone]   Fax: [phone]  Website:  [email or web address] Roger York 08/03/2021, 5:05 PM

## 2021-08-05 ENCOUNTER — Other Ambulatory Visit: Payer: Self-pay

## 2021-08-05 ENCOUNTER — Ambulatory Visit (HOSPITAL_COMMUNITY)
Admission: RE | Admit: 2021-08-05 | Discharge: 2021-08-05 | Disposition: A | Discharge status: Home / Self Care | Payer: Managed Care, Other (non HMO) | Source: Ambulatory Visit | Attending: Internal Medicine | Admitting: Internal Medicine

## 2021-08-05 DIAGNOSIS — R55 Syncope and collapse: Secondary | ICD-10-CM | POA: Insufficient documentation

## 2021-08-05 LAB — ECHOCARDIOGRAM COMPLETE
Area-P 1/2: 2.69 cm2
S' Lateral: 3.6 cm

## 2022-04-10 ENCOUNTER — Encounter (HOSPITAL_COMMUNITY): Payer: Self-pay

## 2022-04-10 ENCOUNTER — Ambulatory Visit (HOSPITAL_COMMUNITY)
Admission: EM | Admit: 2022-04-10 | Discharge: 2022-04-10 | Disposition: A | Discharge status: Home / Self Care | Payer: Managed Care, Other (non HMO) | Attending: Physician Assistant | Admitting: Physician Assistant

## 2022-04-10 DIAGNOSIS — Z113 Encounter for screening for infections with a predominantly sexual mode of transmission: Secondary | ICD-10-CM | POA: Diagnosis present

## 2022-04-10 DIAGNOSIS — N50812 Left testicular pain: Secondary | ICD-10-CM | POA: Diagnosis present

## 2022-04-10 DIAGNOSIS — N50811 Right testicular pain: Secondary | ICD-10-CM | POA: Diagnosis present

## 2022-04-10 LAB — POCT URINALYSIS DIPSTICK, ED / UC
Bilirubin Urine: NEGATIVE
Glucose, UA: NEGATIVE mg/dL
Hgb urine dipstick: NEGATIVE
Ketones, ur: NEGATIVE mg/dL
Leukocytes,Ua: NEGATIVE
Nitrite: NEGATIVE
Protein, ur: NEGATIVE mg/dL
Specific Gravity, Urine: <=1.005 (ref 1.005–1.030)
Urobilinogen, UA: 0.2 mg/dL (ref 0.0–1.0)
pH: 5.5 (ref 5.0–8.0)

## 2022-04-10 MED ORDER — CEFTRIAXONE SODIUM 500 MG IJ SOLR
500.0000 mg | Freq: Once | INTRAMUSCULAR | Status: AC
  Administered 2022-04-10: 500 mg via INTRAMUSCULAR

## 2022-04-10 MED ORDER — DOXYCYCLINE HYCLATE 100 MG PO CAPS: 100.0000 mg | ORAL_CAPSULE | Freq: Two times a day (BID) | ORAL | 0 refills | Status: AC

## 2022-04-10 MED ORDER — CEFTRIAXONE SODIUM 500 MG IJ SOLR
INTRAMUSCULAR | Status: AC
  Filled 2022-04-10: qty 500

## 2022-04-10 NOTE — Discharge Instructions (Signed)
We will call with test results and change treatment plan if indicated If no improvement or symptoms become worse follow up with Primary Care Physician  I have also included the urologist contact for follow up if needed.  Abstain from sexual intercourse until labs results are back and all test results received.

## 2022-04-10 NOTE — ED Provider Notes (Signed)
[facility]    CSN: [id number] Arrival date & time: 04/10/22  1002      History   Chief Complaint Chief Complaint  Patient presents with   Groin Swelling    HPI Roger York is a 46 y.o. male.   Pt presents with testicular discomfort, reports the pain is about 3/10.  Discomfort started three days ago.  Pt denies penile pain, discharge, abdominal pain, dysuria, hematuria.  He is requesting STD testing today.  He has taken nothing for the sx.     Past Medical History:  Diagnosis Date   Syncope     There are no problems to display for this patient.   History reviewed. No pertinent surgical history.     Home Medications    Prior to Admission medications   Medication Sig Start Date End Date Taking? Authorizing Provider  doxycycline (VIBRAMYCIN) 100 MG capsule Take 1 capsule (100 mg total) by mouth 2 (two) times daily for 10 days. 04/10/22 04/20/22 Yes Ward, Tylene Fantasia, PA-C  ipratropium (ATROVENT) 0.03 % nasal spray Place 2 sprays into both nostrils 2 (two) times daily. Patient not taking: Reported on 01/25/2018 04/20/14   Porfirio Oar, PA  levofloxacin (LEVAQUIN) 500 MG tablet Take 1 tablet (500 mg total) by mouth daily. Patient not taking: Reported on 01/25/2018 06/18/14   Shade Flood, MD  naproxen (NAPROSYN) 500 MG tablet 1 tablet with food or milk as needed 07/26/21   [provider]  Omega-3 Fatty Acids (FISH OIL PO) Take 1 tablet by mouth daily.    [provider]  sildenafil (REVATIO) 20 MG tablet SMARTSIG:2-5 Tablet(s) By Mouth 03/29/22   [provider]    Family History Family History  Problem Relation Age of Onset   Muscular dystrophy Son     Social History Social History   Tobacco Use   Smoking status: Never   Smokeless tobacco: Never  Vaping Use   Vaping Use: Never used  Substance Use Topics   Alcohol use: Yes    Alcohol/week: 8.0 standard drinks of alcohol    Types: 8 drink(s) per week   Drug use:  No     Allergies   Penicillins   Review of Systems Review of Systems  Constitutional:  Negative for chills and fever.  HENT:  Negative for ear pain and sore throat.   Eyes:  Negative for pain and visual disturbance.  Respiratory:  Negative for cough and shortness of breath.   Cardiovascular:  Negative for chest pain and palpitations.  Gastrointestinal:  Negative for abdominal pain and vomiting.  Genitourinary:  Positive for testicular pain. Negative for dysuria, hematuria and urgency.  Musculoskeletal:  Negative for arthralgias and back pain.  Skin:  Negative for color change and rash.  Neurological:  Negative for seizures and syncope.  All other systems reviewed and are negative.    Physical Exam Triage Vital Signs ED Triage Vitals  Enc Vitals Group     BP 04/10/22 1018 132/78     Pulse Rate 04/10/22 1018 70     Resp 04/10/22 1018 12     Temp 04/10/22 1018 97.8 F (36.6 C)     Temp src --      SpO2 04/10/22 1018 98 %     Weight 04/10/22 1017 175 lb (79.4 kg)     Height 04/10/22 1017 5\' 10"  (1.778 m)     Head Circumference --      Peak Flow --  Pain Score 04/10/22 1020 3     Pain Loc --      Pain Edu? --      Excl. in [place]? --    No data found.  Updated Vital Signs BP 132/78 (BP Location: Right Arm)   Pulse 70   Temp 97.8 F (36.6 C)   Resp 12   Ht 5\' 10"  (1.778 m)   Wt 175 lb (79.4 kg)   SpO2 98%   BMI 25.11 kg/m   Visual Acuity Right Eye Distance:   Left Eye Distance:   Bilateral Distance:    Right Eye Near:   Left Eye Near:    Bilateral Near:     Physical Exam Vitals and nursing note reviewed.  Constitutional:      General: He is not in acute distress.    Appearance: He is well-developed.  HENT:     Head: Normocephalic and atraumatic.  Eyes:     Conjunctiva/sclera: Conjunctivae normal.  Cardiovascular:     Rate and Rhythm: Normal rate and regular rhythm.     Heart sounds: No murmur heard. Pulmonary:     Effort: Pulmonary effort is  normal. No respiratory distress.     Breath sounds: Normal breath sounds.  Abdominal:     Palpations: Abdomen is soft.     Tenderness: There is no abdominal tenderness.  Genitourinary:    Comments: No testicular swelling appreciated on exam.  No TTP, no penile discharge.  Musculoskeletal:        General: No swelling.     Cervical back: Neck supple.  Skin:    General: Skin is warm and dry.     Capillary Refill: Capillary refill takes less than 2 seconds.  Neurological:     Mental Status: He is alert.  Psychiatric:        Mood and Affect: Mood normal.      UC Treatments / Results  Labs (all labs ordered are listed, but only abnormal results are displayed) Labs Reviewed  POCT URINALYSIS DIPSTICK, ED / UC  CYTOLOGY, (ORAL, ANAL, URETHRAL) ANCILLARY ONLY    EKG   Radiology No results found.  Procedures Procedures (including critical care time)  Medications Ordered in UC Medications  cefTRIAXone (ROCEPHIN) injection 500 mg (has no administration in time range)    Initial Impression / Assessment and Plan / UC Course  I have reviewed the triage vital signs and the nursing notes.  Pertinent labs & imaging results that were available during my care of the patient were reviewed by me and considered in my medical decision making (see chart for details).     Given testicular discomfort will cover for gonorrhea/chlamydia.  Cytology pending.  No swelling appreciated on exam, no concern for testicular swelling, hernia at this time.  Advised follow up with PCP.  Urology contact given as well.  Final Clinical Impressions(s) / UC Diagnoses   Final diagnoses:  Pain in both testicles     Discharge Instructions      We will call with test results and change treatment plan if indicated If no improvement or symptoms become worse follow up with Primary Care Physician  I have also included the urologist contact for follow up if needed.  Abstain from sexual intercourse until labs  results are back and all test results received.    ED Prescriptions     Medication Sig Dispense Auth. Provider   doxycycline (VIBRAMYCIN) 100 MG capsule Take 1 capsule (100 mg total) by mouth 2 (two) times  daily for 10 days. 20 capsule Ward, Tylene Fantasia, PA-C      PDMP not reviewed this encounter.   Ward, Tylene Fantasia, PA-C 04/10/22 1054

## 2022-04-10 NOTE — ED Triage Notes (Signed)
Pt is here for testicles swollen . Pt states the pain is like a discomfort pt would also like to get tested for a STD

## 2022-04-11 DIAGNOSIS — R1032 Left lower quadrant pain: Secondary | ICD-10-CM | POA: Insufficient documentation

## 2022-04-11 LAB — CYTOLOGY, (ORAL, ANAL, URETHRAL) ANCILLARY ONLY
Chlamydia: NEGATIVE
Comment: NEGATIVE
Comment: NEGATIVE
Comment: NORMAL
Neisseria Gonorrhea: NEGATIVE
Trichomonas: NEGATIVE

## 2022-12-30 NOTE — Progress Notes (Unsigned)
   Rubin Payor, PhD, LAT, ATC acting as a scribe for Clementeen Graham, MD.  Roger York is a 47 y.o. male who presents to [facility] at [facility] today for L groin pain x ***. MOI:?*** Pt locates pain to ***  Aggravates: Treatments tried:  Pertinent review of systems: ***  Relevant historical information: ***   Exam:  There were no vitals taken for this visit. General: Well Developed, well nourished, and in no acute distress.   MSK: ***    Lab and Radiology Results No results found for this or any previous visit (from the past 72 hour(s)). No results found.     Assessment and Plan: 48 y.o. male with ***   PDMP not reviewed this encounter. No orders of the defined types were placed in this encounter.  No orders of the defined types were placed in this encounter.    Discussed warning signs or symptoms. Please see discharge instructions. Patient expresses understanding.   ***

## 2023-01-02 ENCOUNTER — Ambulatory Visit (INDEPENDENT_AMBULATORY_CARE_PROVIDER_SITE_OTHER): Payer: Managed Care, Other (non HMO) | Admitting: Family Medicine

## 2023-01-02 ENCOUNTER — Ambulatory Visit (INDEPENDENT_AMBULATORY_CARE_PROVIDER_SITE_OTHER): Payer: Managed Care, Other (non HMO)

## 2023-01-02 VITALS — BP 122/80 | HR 63 | Ht 70.0 in | Wt 179.8 lb

## 2023-01-02 DIAGNOSIS — R1032 Left lower quadrant pain: Secondary | ICD-10-CM | POA: Diagnosis not present

## 2023-01-02 DIAGNOSIS — N529 Male erectile dysfunction, unspecified: Secondary | ICD-10-CM | POA: Insufficient documentation

## 2023-01-02 NOTE — Patient Instructions (Addendum)
Thank you for coming in today.   Please get an Xray today before you leave   I've referred you to Physical Therapy.  Let us know if you don't hear from them in one week.   Recheck in 6 weeks. If not better consider MRI arthrogram or just an injection.

## 2023-01-06 NOTE — Progress Notes (Signed)
Left hip x-ray looks normal to radiology.

## 2023-01-12 NOTE — Therapy (Signed)
OUTPATIENT PHYSICAL THERAPY LOWER EXTREMITY EVALUATION   Patient Name: Roger York MRN: [id number] DOB:Apr 22, 1976, 47 y.o., male Today's Date: 01/12/2023  END OF SESSION:   Past Medical History:  Diagnosis Date   Syncope    No past surgical history on file. Patient Active Problem List   Diagnosis Date Noted   Erectile dysfunction 01/02/2023   Groin discomfort, left 04/11/2022    PCP: [facility]  REFERRING PROVIDER: Rodolph Bong, MD  REFERRING DIAG:  Diagnosis  R10.32 (ICD-10-CM) - Groin discomfort, left    THERAPY DIAG:  No diagnosis found.  Rationale for Evaluation and Treatment: Rehabilitation  ONSET DATE: September 2023  SUBJECTIVE:   SUBJECTIVE STATEMENT: Roger York noted some left groin pain dating back to September 2023.  He recalls a "floor exercise" at the gym might have irritated it.  He has a son with MS that requires a lot of lifting.  He is now using the [place] lift with his son to avoid aggravating hip groin/hip.    PERTINENT HISTORY: NA PAIN:  Are you having pain? Yes: NPRS scale: 0-2/10 Pain location: Left groin Pain description: Muscle pull Aggravating factors: After intense days at the gym, with specific transfers with his son and with prolonged sitting Relieving factors: No  PRECAUTIONS: None  WEIGHT BEARING RESTRICTIONS: No  FALLS:  Has patient fallen in last 6 months? No  LIVING ENVIRONMENT: Lives with: lives with their family and lives with their spouse Lives in: House/apartment Stairs:  No problems Has following equipment at home: None  OCCUPATION: Sales, needs to drive long distances  PLOF: Independent  PATIENT GOALS: Get rid of annoying groin pain so he can return to full function and not have to be as careful to avoid aggravating it  NEXT MD VISIT: After PT  OBJECTIVE:   DIAGNOSTIC FINDINGS: FINDINGS: There is no evidence of hip fracture or dislocation. There is no evidence of arthropathy  or other focal bone abnormality.  PATIENT SURVEYS:  FOTO 86 (risk-adjusted 66, Goal 87)  COGNITION: Overall cognitive status: Within functional limits for tasks assessed     SENSATION: No complaints of peripheral pain or paresthesias   MUSCLE LENGTH: Hamstrings: Right 30 deg; Left 30 deg   LOWER EXTREMITY ROM:  Passive ROM Left/Right 01/13/2023   Hip flexion 95/95   Hip extension    Hip abduction    Hip adduction    Hip internal rotation 6/7   Hip external rotation 28/23   Knee flexion    Knee extension    Ankle dorsiflexion    Ankle plantarflexion    Ankle inversion    Ankle eversion     (Blank rows = not tested)  LOWER EXTREMITY STRENGTH:  MMT Left/Right 01/13/2023   Hip flexion 5/5   Hip extension    Hip abduction 5-/5-   Hip adduction 5/5   Hip internal rotation    Hip external rotation    Knee flexion    Knee extension 5/5   Ankle dorsiflexion    Ankle plantarflexion    Ankle inversion    Ankle eversion     (Blank rows = not tested)  GAIT: Distance walked: In the office Assistive device utilized: None Level of assistance: Complete Independence Comments: No deviations were noted   TODAY'S TREATMENT:  DATE: 01/13/2023  Modified Thomas stretch 2 x 20 seconds Figure 4 stretch 2 x 20 seconds Gluteal stretch 2 x 20 seconds Hamstrings stretch 2 x 20 seconds Alternating hip hike 2 sets of 10 for 3 seconds Side lie hip abduction 10 x 3 seconds  PATIENT EDUCATION:  Education details: Reviewed exam findings, hip anatomy and home exercise program Person educated: Patient Education method: Explanation, Demonstration, Tactile cues, Verbal cues, and Handouts Education comprehension: verbalized understanding, returned demonstration, verbal cues required, tactile cues required, and needs further education  HOME EXERCISE  PROGRAM: [id number]  ASSESSMENT:  CLINICAL IMPRESSION: Patient is a 47 y.o. male who was seen today for physical therapy evaluation and treatment for  Diagnosis  R10.32 (ICD-10-CM) - Groin discomfort, left  .  Roger York has left groin discomfort dating back to September of last year.  He is unsure as to the specific cause, although he thinks a for exercise at the gym may have aggravated it.  Roger York is very physically active.  He runs, cares for his son with special needs and is very active at the gym.  He is still working out, but has had to modify some activities just to be conservative so as not to really aggravate the groin.  He would like for the pain to be gone so he can return to his normal function without restriction.  AROM, flexibility and hip abductor strength impairments were noted and were addressed with his beginning home exercise program.  OBJECTIVE IMPAIRMENTS: decreased knowledge of condition, decreased ROM, decreased strength, impaired flexibility, and pain.   ACTIVITY LIMITATIONS: lifting, sitting, squatting, and caring for others  PARTICIPATION LIMITATIONS: driving and occupation  PERSONAL FACTORS:  No personal factors  are affecting this patient's functional outcome.   REHAB POTENTIAL: Good  CLINICAL DECISION MAKING: Stable/uncomplicated  EVALUATION COMPLEXITY: Low   GOALS: Goals reviewed with patient? Yes  SHORT TERM GOALS: Target date: 02/10/2023 Roger York will be independent with his day 1 HEP Baseline: Started 01/13/2023 Goal status: INITIAL  2.  Improve hip flexibility for hip flexors to 105 degrees; hamstrings to 50 degrees and hip ER to 40 degrees Baseline: 95; 30-35 and 28-23, respectively Goal status: INITIAL   LONG TERM GOALS: Target date: 03/10/2023  Improve FOTO to 87 in 10 visits Baseline: Risk adjusted 66 Goal status: INITIAL  2.  Roger York will report left groin pain consistently 0-1/10 on the Numeric Pain Rating Scale Baseline: 0-2/10 Goal status:  INITIAL  3.  Improve bilateral hip abductor strength 5/5 MMT Baseline: 5-/5 MMT Goal status: INITIAL  4.  Roger York will be independent with his long-term maintenance HEP at DC Baseline: Started 01/13/2023 Goal status: INITIAL   PLAN:  PT FREQUENCY: 1x/week  PT DURATION: 8 weeks  PLANNED INTERVENTIONS: Therapeutic exercises, Therapeutic activity, Neuromuscular re-education, Balance training, Patient/Family education, Self Care, Dry Needling, Cryotherapy, and Manual therapy  PLAN FOR NEXT SESSION: Review activities from day 1 and consider additional hip abductor strengthening activities.   Cherlyn Cushing, PT, MPT 01/12/2023, 4:16 PM

## 2023-01-13 ENCOUNTER — Ambulatory Visit: Payer: Managed Care, Other (non HMO) | Admitting: Rehabilitative and Restorative Service Providers"

## 2023-01-13 ENCOUNTER — Encounter: Payer: Self-pay | Admitting: Rehabilitative and Restorative Service Providers"

## 2023-01-13 DIAGNOSIS — M25652 Stiffness of left hip, not elsewhere classified: Secondary | ICD-10-CM

## 2023-01-13 DIAGNOSIS — M25552 Pain in left hip: Secondary | ICD-10-CM

## 2023-01-13 DIAGNOSIS — M6281 Muscle weakness (generalized): Secondary | ICD-10-CM | POA: Diagnosis not present

## 2023-01-20 ENCOUNTER — Ambulatory Visit (INDEPENDENT_AMBULATORY_CARE_PROVIDER_SITE_OTHER): Payer: Managed Care, Other (non HMO) | Admitting: Rehabilitative and Restorative Service Providers"

## 2023-01-20 ENCOUNTER — Encounter: Payer: Self-pay | Admitting: Rehabilitative and Restorative Service Providers"

## 2023-01-20 DIAGNOSIS — M25652 Stiffness of left hip, not elsewhere classified: Secondary | ICD-10-CM

## 2023-01-20 DIAGNOSIS — M25552 Pain in left hip: Secondary | ICD-10-CM | POA: Diagnosis not present

## 2023-01-20 DIAGNOSIS — M6281 Muscle weakness (generalized): Secondary | ICD-10-CM

## 2023-01-20 NOTE — Therapy (Signed)
OUTPATIENT PHYSICAL THERAPY LOWER EXTREMITY TREATMENT   Patient Name: Roger York MRN: [id number] DOB:April 29, 1976, 47 y.o., male Today's Date: 01/20/2023  END OF SESSION:  PT End of Session - 01/20/23 1149     Visit Number 2    Number of Visits 10    Date for PT Re-Evaluation 03/10/23    Progress Note Due on Visit 10    PT Start Time 1102    PT Stop Time 1144    PT Time Calculation (min) 42 min    Activity Tolerance Patient tolerated treatment well;No increased pain    Behavior During Therapy WFL for tasks assessed/performed             Past Medical History:  Diagnosis Date   Syncope    History reviewed. No pertinent surgical history. Patient Active Problem List   Diagnosis Date Noted   Erectile dysfunction 01/02/2023   Groin discomfort, left 04/11/2022    PCP: [facility] @ [facility]  REFERRING PROVIDER: Rodolph Bong, MD  REFERRING DIAG:  Diagnosis  R10.32 (ICD-10-CM) - Groin discomfort, left    THERAPY DIAG:  Muscle weakness (generalized)  Stiffness of left hip, not elsewhere classified  Pain in left hip  Rationale for Evaluation and Treatment: Rehabilitation  ONSET DATE: September 2023  SUBJECTIVE:   SUBJECTIVE STATEMENT: Roger York notes 1-2 times per day HEP compliance (2x per day recommended).  His left groin pain dates back to September 2023.    PERTINENT HISTORY: NA PAIN:  Are you having pain? Yes: NPRS scale: 0-2/10 Pain location: Left groin Pain description: Muscle pull Aggravating factors: After intense days at the gym, with specific transfers with his son and with prolonged sitting Relieving factors: No  PRECAUTIONS: None  WEIGHT BEARING RESTRICTIONS: No  FALLS:  Has patient fallen in last 6 months? No  LIVING ENVIRONMENT: Lives with: lives with their family and lives with their spouse Lives in: House/apartment Stairs:  No problems Has following equipment at home: None  OCCUPATION: Sales, needs to drive  long distances  PLOF: Independent  PATIENT GOALS: Get rid of annoying groin pain so he can return to full function and not have to be as careful to avoid aggravating it  NEXT MD VISIT: After PT  OBJECTIVE:   DIAGNOSTIC FINDINGS: FINDINGS: There is no evidence of hip fracture or dislocation. There is no evidence of arthropathy or other focal bone abnormality.  PATIENT SURVEYS:  FOTO 86 (risk-adjusted 66, Goal 87)  COGNITION: Overall cognitive status: Within functional limits for tasks assessed     SENSATION: No complaints of peripheral pain or paresthesias   MUSCLE LENGTH: Hamstrings: Right 30 deg; Left 30 deg   LOWER EXTREMITY ROM:  Passive ROM Left/Right 01/13/2023   Hip flexion 95/95   Hip extension    Hip abduction    Hip adduction    Hip internal rotation 6/7   Hip external rotation 28/23   Knee flexion    Knee extension    Ankle dorsiflexion    Ankle plantarflexion    Ankle inversion    Ankle eversion     (Blank rows = not tested)  LOWER EXTREMITY STRENGTH:  MMT Left/Right 01/13/2023   Hip flexion 5/5   Hip extension    Hip abduction 5-/5-   Hip adduction 5/5   Hip internal rotation    Hip external rotation    Knee flexion    Knee extension 5/5   Ankle dorsiflexion    Ankle plantarflexion    Ankle  inversion    Ankle eversion     (Blank rows = not tested)  GAIT: Distance walked: In the office Assistive device utilized: None Level of assistance: Complete Independence Comments: No deviations were noted   TODAY'S TREATMENT:                                                                                                                              DATE:  01/20/2023 Modified Thomas stretch 5 x 20 seconds Figure 4 stretch 5 x 20 seconds Gluteal stretch 5 x 20 seconds Hamstrings stretch 5 x 20 seconds Side lie hip abduction 10 x 3 seconds with 2 pillows between knees Alternating hip hike 10 for 3 seconds Hip abduction with pelvic stabilization 2  sets of 5 for 5 seconds   01/13/2023  Modified Thomas stretch 2 x 20 seconds Figure 4 stretch 2 x 20 seconds Gluteal stretch 2 x 20 seconds Hamstrings stretch 2 x 20 seconds Alternating hip hike 2 sets of 10 for 3 seconds Side lie hip abduction 10 x 3 seconds  PATIENT EDUCATION:  Education details: Reviewed exam findings, hip anatomy and home exercise program Person educated: Patient Education method: Explanation, Demonstration, Tactile cues, Verbal cues, and Handouts Education comprehension: verbalized understanding, returned demonstration, verbal cues required, tactile cues required, and needs further education  HOME EXERCISE PROGRAM: [id number]  ASSESSMENT:  CLINICAL IMPRESSION: Roger York has left groin discomfort dating back to September of last year.  He did benefit from professional feedback/correction with his day 1 exercises and I was able to progress one of his strength/stabilization challenges.  He will attend 1 additional visit to update and review his home exercises before ~ 3 weeks of independent rehabilitation and a follow-up reassessment.  OBJECTIVE IMPAIRMENTS: decreased knowledge of condition, decreased ROM, decreased strength, impaired flexibility, and pain.   ACTIVITY LIMITATIONS: lifting, sitting, squatting, and caring for others  PARTICIPATION LIMITATIONS: driving and occupation  PERSONAL FACTORS:  No personal factors  are affecting this patient's functional outcome.   REHAB POTENTIAL: Good  CLINICAL DECISION MAKING: Stable/uncomplicated  EVALUATION COMPLEXITY: Low   GOALS: Goals reviewed with patient? Yes  SHORT TERM GOALS: Target date: 02/10/2023 Roger York will be independent with his day 1 HEP Baseline: Started 01/13/2023 Goal status: On Going 01/20/2023  2.  Improve hip flexibility for hip flexors to 105 degrees; hamstrings to 50 degrees and hip ER to 40 degrees Baseline: 95; 30-35 and 28-23, respectively Goal status: INITIAL   LONG TERM GOALS: Target  date: 03/10/2023  Improve FOTO to 87 in 10 visits Baseline: Risk adjusted 66 Goal status: INITIAL  2.  Roger York will report left groin pain consistently 0-1/10 on the Numeric Pain Rating Scale Baseline: 0-2/10 Goal status: On Going 01/20/2023  3.  Improve bilateral hip abductor strength 5/5 MMT Baseline: 5-/5 MMT Goal status: INITIAL  4.  Roger York will be independent with his long-term maintenance HEP at DC Baseline: Started 01/13/2023 Goal status: On Going 01/20/2023  PLAN:  PT FREQUENCY: 1x/week  PT DURATION: 8 weeks  PLANNED INTERVENTIONS: Therapeutic exercises, Therapeutic activity, Neuromuscular re-education, Balance training, Patient/Family education, Self Care, Dry Needling, Cryotherapy, and Manual therapy  PLAN FOR NEXT SESSION: 4 x with stretches to allow time for hip abduction with pelvic stabilization, single-leg balance and step-down exercises.  Update HEP for 3 weeks of independent rehabilitation.   Cherlyn Cushing, PT, MPT 01/20/2023, 11:56 AM

## 2023-01-25 ENCOUNTER — Ambulatory Visit: Payer: Managed Care, Other (non HMO) | Admitting: Rehabilitative and Restorative Service Providers"

## 2023-01-25 ENCOUNTER — Encounter: Payer: Self-pay | Admitting: Rehabilitative and Restorative Service Providers"

## 2023-01-25 DIAGNOSIS — M25652 Stiffness of left hip, not elsewhere classified: Secondary | ICD-10-CM | POA: Diagnosis not present

## 2023-01-25 DIAGNOSIS — M25552 Pain in left hip: Secondary | ICD-10-CM | POA: Diagnosis not present

## 2023-01-25 DIAGNOSIS — M6281 Muscle weakness (generalized): Secondary | ICD-10-CM | POA: Diagnosis not present

## 2023-01-25 NOTE — Therapy (Signed)
OUTPATIENT PHYSICAL THERAPY LOWER EXTREMITY TREATMENT   Patient Name: Roger York MRN: [id number] DOB:June 03, 1976, 47 y.o., male Today's Date: 01/25/2023  END OF SESSION:  PT End of Session - 01/25/23 1426     Visit Number 3    Number of Visits 10    Date for PT Re-Evaluation 03/10/23    Progress Note Due on Visit 10    PT Start Time 1425    PT Stop Time 1511    PT Time Calculation (min) 46 min    Activity Tolerance Patient tolerated treatment well;No increased pain    Behavior During Therapy WFL for tasks assessed/performed              Past Medical History:  Diagnosis Date   Syncope    History reviewed. No pertinent surgical history. Patient Active Problem List   Diagnosis Date Noted   Erectile dysfunction 01/02/2023   Groin discomfort, left 04/11/2022    PCP: [facility] @ [facility]  REFERRING PROVIDER: Rodolph Bong, MD  REFERRING DIAG:  Diagnosis  R10.32 (ICD-10-CM) - Groin discomfort, left    THERAPY DIAG:  Muscle weakness (generalized)  Stiffness of left hip, not elsewhere classified  Pain in left hip  Rationale for Evaluation and Treatment: Rehabilitation  ONSET DATE: September 2023  SUBJECTIVE:   SUBJECTIVE STATEMENT: Roger York notes symptoms are now more anterior hip and iliac crest vs groin.  1-2 times per day HEP compliance (2x per day recommended).  His left groin pain dates back to September 2023.    PERTINENT HISTORY: NA PAIN:  Are you having pain? Yes: NPRS scale: 0-2/10 Pain location: Left groin Pain description: Muscle pull Aggravating factors: After intense days at the gym, with specific transfers with his son and with prolonged sitting Relieving factors: No  PRECAUTIONS: None  WEIGHT BEARING RESTRICTIONS: No  FALLS:  Has patient fallen in last 6 months? No  LIVING ENVIRONMENT: Lives with: lives with their family and lives with their spouse Lives in: House/apartment Stairs:  No problems Has following  equipment at home: None  OCCUPATION: Sales, needs to drive long distances  PLOF: Independent  PATIENT GOALS: Get rid of annoying groin pain so he can return to full function and not have to be as careful to avoid aggravating it  NEXT MD VISIT: After PT  OBJECTIVE:   DIAGNOSTIC FINDINGS: FINDINGS: There is no evidence of hip fracture or dislocation. There is no evidence of arthropathy or other focal bone abnormality.  PATIENT SURVEYS:  FOTO 86 (risk-adjusted 66, Goal 87)  COGNITION: Overall cognitive status: Within functional limits for tasks assessed     SENSATION: No complaints of peripheral pain or paresthesias   MUSCLE LENGTH: Hamstrings: Right 30 deg; Left 30 deg   LOWER EXTREMITY ROM:  Passive ROM Left/Right 01/13/2023   Hip flexion 95/95   Hip extension    Hip abduction    Hip adduction    Hip internal rotation 6/7   Hip external rotation 28/23   Knee flexion    Knee extension    Ankle dorsiflexion    Ankle plantarflexion    Ankle inversion    Ankle eversion     (Blank rows = not tested)  LOWER EXTREMITY STRENGTH:  MMT Left/Right 01/13/2023   Hip flexion 5/5   Hip extension    Hip abduction 5-/5-   Hip adduction 5/5   Hip internal rotation    Hip external rotation    Knee flexion    Knee extension 5/5  Ankle dorsiflexion    Ankle plantarflexion    Ankle inversion    Ankle eversion     (Blank rows = not tested)  GAIT: Distance walked: In the office Assistive device utilized: None Level of assistance: Complete Independence Comments: No deviations were noted   TODAY'S TREATMENT:                                                                                                                              DATE:  01/25/2023 Modified Thomas stretch 4 x 20 seconds Figure 4 stretch 4 x 20 seconds Gluteal stretch 4 x 20 seconds Hamstrings stretch 4 x 20 seconds Side lie hip abduction 10 x 3 seconds with 2 pillows between knees Prone alternating  hip extensions 10 x 5 seconds Alternating hip hike 10 for 3 seconds Hip abduction with pelvic stabilization 2 sets of 5 for 5 seconds  Functional Activities: Slow eccentrics step-down off 8 inch step 2 sets of 10 bilateral  Neuromuscular re-education Single leg balance 4 x 20 seconds bilateral   01/20/2023 Modified Thomas stretch 5 x 20 seconds Figure 4 stretch 5 x 20 seconds Gluteal stretch 5 x 20 seconds Hamstrings stretch 5 x 20 seconds Side lie hip abduction 10 x 3 seconds with 2 pillows between knees Alternating hip hike 10 for 3 seconds Hip abduction with pelvic stabilization 2 sets of 5 for 5 seconds   01/13/2023  Modified Thomas stretch 2 x 20 seconds Figure 4 stretch 2 x 20 seconds Gluteal stretch 2 x 20 seconds Hamstrings stretch 2 x 20 seconds Alternating hip hike 2 sets of 10 for 3 seconds Side lie hip abduction 10 x 3 seconds  PATIENT EDUCATION:  Education details: Reviewed exam findings, hip anatomy and home exercise program Person educated: Patient Education method: Explanation, Demonstration, Tactile cues, Verbal cues, and Handouts Education comprehension: verbalized understanding, returned demonstration, verbal cues required, tactile cues required, and needs further education  HOME EXERCISE PROGRAM: [id number]  ASSESSMENT:  CLINICAL IMPRESSION: Roger York has left groin discomfort dating back to September of last year.  Weakness is noted on his left side with strength, balance and functional activities.  He will be on vacation and have ~ 3 weeks of independent rehabilitation before a follow-up reassessment on his next visit.  OBJECTIVE IMPAIRMENTS: decreased knowledge of condition, decreased ROM, decreased strength, impaired flexibility, and pain.   ACTIVITY LIMITATIONS: lifting, sitting, squatting, and caring for others  PARTICIPATION LIMITATIONS: driving and occupation  PERSONAL FACTORS:  No personal factors  are affecting this patient's functional outcome.    REHAB POTENTIAL: Good  CLINICAL DECISION MAKING: Stable/uncomplicated  EVALUATION COMPLEXITY: Low   GOALS: Goals reviewed with patient? Yes  SHORT TERM GOALS: Target date: 02/10/2023 Roger York will be independent with his day 1 HEP Baseline: Started 01/13/2023 Goal status: Met 01/25/2023  2.  Improve hip flexibility for hip flexors to 105 degrees; hamstrings to 50 degrees and hip ER to 40 degrees Baseline: 95; 30-35  and 28-23, respectively Goal status: INITIAL   LONG TERM GOALS: Target date: 03/10/2023  Improve FOTO to 87 in 10 visits Baseline: Risk adjusted 66 Goal status: INITIAL  2.  Roger York will report left groin pain consistently 0-1/10 on the Numeric Pain Rating Scale Baseline: 0-2/10 Goal status: On Going 01/25/2023  3.  Improve bilateral hip abductor strength 5/5 MMT Baseline: 5-/5 MMT Goal status: INITIAL  4.  Roger York will be independent with his long-term maintenance HEP at DC Baseline: Started 01/13/2023 Goal status: On Going 01/25/2023   PLAN:  PT FREQUENCY: 1x/week  PT DURATION: 8 weeks  PLANNED INTERVENTIONS: Therapeutic exercises, Therapeutic activity, Neuromuscular re-education, Balance training, Patient/Family education, Self Care, Dry Needling, Cryotherapy, and Manual therapy  PLAN FOR NEXT SESSION: Reassess AROM, strength, FOTO.  Given weakness seen on the left side with strength, balance and functional activities, he may need more time with his PT.  Consider a follow-up with Dr. Denyse Amass.   Cherlyn Cushing, PT, MPT 01/25/2023, 3:15 PM

## 2023-02-17 ENCOUNTER — Encounter: Payer: Self-pay | Admitting: Rehabilitative and Restorative Service Providers"

## 2023-02-17 ENCOUNTER — Ambulatory Visit (INDEPENDENT_AMBULATORY_CARE_PROVIDER_SITE_OTHER): Payer: Managed Care, Other (non HMO) | Admitting: Rehabilitative and Restorative Service Providers"

## 2023-02-17 DIAGNOSIS — M6281 Muscle weakness (generalized): Secondary | ICD-10-CM | POA: Diagnosis not present

## 2023-02-17 DIAGNOSIS — M25552 Pain in left hip: Secondary | ICD-10-CM | POA: Diagnosis not present

## 2023-02-17 DIAGNOSIS — M25652 Stiffness of left hip, not elsewhere classified: Secondary | ICD-10-CM | POA: Diagnosis not present

## 2023-02-17 NOTE — Therapy (Signed)
OUTPATIENT PHYSICAL THERAPY LOWER EXTREMITY TREATMENT/DISCHARGE PHYSICAL THERAPY DISCHARGE SUMMARY  Visits from Start of Care: 4  Current functional level related to goals / functional outcomes: See note   Remaining deficits: See note   Education / Equipment: HEP   Patient agrees to discharge. Patient goals were partially met. Patient is being discharged due to  independent with HEP and recommendations for MD follow-up.    Patient Name: Roger York MRN: [id number] DOB:Jun 16, 1976, 47 y.o., male Today's Date: 02/17/2023  END OF SESSION:  PT End of Session - 02/17/23 0848     Visit Number 4    Number of Visits 10    Date for PT Re-Evaluation 03/10/23    Progress Note Due on Visit 10    PT Start Time 0848    PT Stop Time 0930    PT Time Calculation (min) 42 min    Activity Tolerance Patient tolerated treatment well;No increased pain    Behavior During Therapy WFL for tasks assessed/performed               Past Medical History:  Diagnosis Date   Syncope    History reviewed. No pertinent surgical history. Patient Active Problem List   Diagnosis Date Noted   Erectile dysfunction 01/02/2023   Groin discomfort, left 04/11/2022    PCP: [facility] @ [facility]  REFERRING PROVIDER: Rodolph Bong, MD  REFERRING DIAG:  Diagnosis  R10.32 (ICD-10-CM) - Groin discomfort, left    THERAPY DIAG:  Muscle weakness (generalized)  Stiffness of left hip, not elsewhere classified  Pain in left hip  Rationale for Evaluation and Treatment: Rehabilitation  ONSET DATE: September 2023  SUBJECTIVE:   SUBJECTIVE STATEMENT: Roger York notes symptoms have improved but are still present.  He has not been running over the past 2 weeks and this is also helping with more irritating symptoms.  His left groin pain dates back to September 2023.    PERTINENT HISTORY: NA PAIN:  Are you having pain? Yes: NPRS scale: 0-2/10 Pain location: Left groin Pain  description: Muscle pull Aggravating factors: After intense days at the gym, with specific transfers with his son and with prolonged sitting Relieving factors: No  PRECAUTIONS: None  WEIGHT BEARING RESTRICTIONS: No  FALLS:  Has patient fallen in last 6 months? No  LIVING ENVIRONMENT: Lives with: lives with their family and lives with their spouse Lives in: House/apartment Stairs:  No problems Has following equipment at home: None  OCCUPATION: Sales, needs to drive long distances  PLOF: Independent  PATIENT GOALS: Get rid of annoying groin pain so he can return to full function and not have to be as careful to avoid aggravating it  NEXT MD VISIT: After PT  OBJECTIVE:   DIAGNOSTIC FINDINGS: FINDINGS: There is no evidence of hip fracture or dislocation. There is no evidence of arthropathy or other focal bone abnormality.  PATIENT SURVEYS:  02/17/2023 FOTO down (system wide, unable to capture)  Eval FOTO 86 (risk-adjusted 66, Goal 87)  COGNITION: Overall cognitive status: Within functional limits for tasks assessed     SENSATION: No complaints of peripheral pain or paresthesias   MUSCLE LENGTH: 02/17/2023 Eval Hamstrings: Right 45 deg; Left 40 deg  Eval Hamstrings: Right 30 deg; Left 30 deg   LOWER EXTREMITY ROM:  Passive ROM Left/Right 01/13/2023 Left/Right 02/17/2023  Hip flexion 95/95 105/110  Hip extension    Hip abduction    Hip adduction    Hip internal rotation 6/7 7/7  Hip external  rotation 28/23 33/37  Knee flexion    Knee extension    Ankle dorsiflexion    Ankle plantarflexion    Ankle inversion    Ankle eversion     (Blank rows = not tested)  LOWER EXTREMITY STRENGTH:  MMT Left/Right 01/13/2023 Left/Right 02/17/2023  Hip flexion 5/5   Hip extension    Hip abduction 5-/5- 5-/5-  Hip adduction 5/5   Hip internal rotation    Hip external rotation    Knee flexion    Knee extension 5/5   Ankle dorsiflexion    Ankle plantarflexion    Ankle  inversion    Ankle eversion     (Blank rows = not tested)  GAIT: Distance walked: In the office Assistive device utilized: None Level of assistance: Complete Independence Comments: No deviations were noted   TODAY'S TREATMENT:                                                                                                                              DATE:  02/17/2023 Modified Thomas stretch 5 x 20 seconds Figure 4 stretch 4 x 20 seconds Gluteal stretch 4 x 20 seconds Hamstrings stretch 4 x 20 seconds Hip abduction with pelvic stabilization 2 sets of 5 for 5 seconds  Functional Activities: Discussed Slow eccentrics step-down off 8 inch step 2 sets of 10 bilateral &  Neuromuscular re-education Single leg balance 4 x 20 seconds bilateral   01/25/2023 Modified Thomas stretch 4 x 20 seconds Figure 4 stretch 4 x 20 seconds Gluteal stretch 4 x 20 seconds Hamstrings stretch 4 x 20 seconds Side lie hip abduction 10 x 3 seconds with 2 pillows between knees Prone alternating hip extensions 10 x 5 seconds Alternating hip hike 10 for 3 seconds Hip abduction with pelvic stabilization 2 sets of 5 for 5 seconds  Functional Activities: Slow eccentrics step-down off 8 inch step 2 sets of 10 bilateral  Neuromuscular re-education Single leg balance 4 x 20 seconds bilateral   01/20/2023 Modified Thomas stretch 5 x 20 seconds Figure 4 stretch 5 x 20 seconds Gluteal stretch 5 x 20 seconds Hamstrings stretch 5 x 20 seconds Side lie hip abduction 10 x 3 seconds with 2 pillows between knees Alternating hip hike 10 for 3 seconds Hip abduction with pelvic stabilization 2 sets of 5 for 5 seconds   PATIENT EDUCATION:  Education details: Reviewed exam findings, hip anatomy and home exercise program Person educated: Patient Education method: Explanation, Demonstration, Tactile cues, Verbal cues, and Handouts Education comprehension: verbalized understanding, returned demonstration, verbal cues  required, tactile cues required, and needs further education  HOME EXERCISE PROGRAM: [id number]  ASSESSMENT:  CLINICAL IMPRESSION: Roger York reports 1 time/day stretching compliance and every other day strengthening compliance while on vacation over the past 2 weeks.  Weakness is improving but is still noted with hip abductors strength testing.  He has shown independence with his HEP, objective progress, particularly with flexibility and has had  less irritating pain.  To be fair he has not been running over the past 2 weeks and this was an irritating factor for his groin/joint pain.  Although Roger York will benefit from continued hip abductors strength work to decrease hip joint compressive forces, I recommended he follow-up with Dr. Denyse Amass for possible MRI to look at his labrum and other hip joint anatomy that X-ray may not show as well.  He plans to do this and continue his current home exercises independently.  OBJECTIVE IMPAIRMENTS: decreased knowledge of condition, decreased ROM, decreased strength, impaired flexibility, and pain.   ACTIVITY LIMITATIONS: lifting, sitting, squatting, and caring for others  PARTICIPATION LIMITATIONS: driving and occupation  PERSONAL FACTORS:  No personal factors  are affecting this patient's functional outcome.   REHAB POTENTIAL: Good  CLINICAL DECISION MAKING: Stable/uncomplicated  EVALUATION COMPLEXITY: Low   GOALS: Goals reviewed with patient? Yes  SHORT TERM GOALS: Target date: 02/10/2023 Roger York will be independent with his day 1 HEP Baseline: Started 01/13/2023 Goal status: Met 01/25/2023  2.  Improve hip flexibility for hip flexors to 105 degrees; hamstrings to 50 degrees and hip ER to 40 degrees Baseline: 95; 30-35 and 28-23, respectively Goal status: Partially Met 02/17/2023   LONG TERM GOALS: Target date: 03/10/2023  Improve FOTO to 87 in 10 visits Baseline: Risk adjusted 66 Goal status: Unable to assess due to FOTO down  2.  Roger York will report  left groin pain consistently 0-1/10 on the Numeric Pain Rating Scale Baseline: 0-2/10 Goal status: On Going 02/17/2023  3.  Improve bilateral hip abductor strength 5/5 MMT Baseline: 5-/5 MMT Goal status: On Going 02/17/2023  4.  Roger York will be independent with his long-term maintenance HEP at DC Baseline: Started 01/13/2023 Goal status: Met 02/17/2023   PLAN:  PT FREQUENCY: DC  PT DURATION: DC  PLANNED INTERVENTIONS: Therapeutic exercises, Therapeutic activity, Neuromuscular re-education, Balance training, Patient/Family education, Self Care, Dry Needling, Cryotherapy, and Manual therapy  PLAN FOR NEXT SESSION: Continue exercises independently and recommend a follow-up with Dr. Denyse Amass.   Cherlyn Cushing, PT, MPT 02/17/2023, 10:29 AM

## 2023-05-09 ENCOUNTER — Other Ambulatory Visit: Payer: Self-pay

## 2023-05-09 ENCOUNTER — Emergency Department (HOSPITAL_COMMUNITY): Payer: Managed Care, Other (non HMO)

## 2023-05-09 ENCOUNTER — Emergency Department (HOSPITAL_COMMUNITY)
Admission: EM | Admit: 2023-05-09 | Discharge: 2023-05-09 | Disposition: A | Discharge status: Home / Self Care | Payer: Managed Care, Other (non HMO) | Attending: Emergency Medicine | Admitting: Emergency Medicine

## 2023-05-09 ENCOUNTER — Encounter (HOSPITAL_COMMUNITY): Payer: Self-pay

## 2023-05-09 DIAGNOSIS — S60411A Abrasion of left index finger, initial encounter: Secondary | ICD-10-CM | POA: Diagnosis not present

## 2023-05-09 DIAGNOSIS — S81851A Open bite, right lower leg, initial encounter: Secondary | ICD-10-CM | POA: Insufficient documentation

## 2023-05-09 DIAGNOSIS — Y9389 Activity, other specified: Secondary | ICD-10-CM | POA: Insufficient documentation

## 2023-05-09 DIAGNOSIS — Y92007 Garden or yard of unspecified non-institutional (private) residence as the place of occurrence of the external cause: Secondary | ICD-10-CM | POA: Insufficient documentation

## 2023-05-09 DIAGNOSIS — S61001A Unspecified open wound of right thumb without damage to nail, initial encounter: Secondary | ICD-10-CM | POA: Diagnosis not present

## 2023-05-09 DIAGNOSIS — W540XXA Bitten by dog, initial encounter: Secondary | ICD-10-CM | POA: Diagnosis not present

## 2023-05-09 DIAGNOSIS — S6991XA Unspecified injury of right wrist, hand and finger(s), initial encounter: Secondary | ICD-10-CM | POA: Diagnosis present

## 2023-05-09 MED ORDER — CLINDAMYCIN HCL 300 MG PO CAPS
300.0000 mg | ORAL_CAPSULE | Freq: Once | ORAL | Status: AC
  Administered 2023-05-09: 300 mg via ORAL
  Filled 2023-05-09: qty 1

## 2023-05-09 MED ORDER — SULFAMETHOXAZOLE-TRIMETHOPRIM 800-160 MG PO TABS
1.0000 | ORAL_TABLET | Freq: Once | ORAL | Status: AC
  Administered 2023-05-09: 1 via ORAL
  Filled 2023-05-09: qty 1

## 2023-05-09 MED ORDER — SULFAMETHOXAZOLE-TRIMETHOPRIM 800-160 MG PO TABS: 1.0000 | ORAL_TABLET | Freq: Two times a day (BID) | ORAL | 0 refills | Status: AC

## 2023-05-09 MED ORDER — CLINDAMYCIN HCL 300 MG PO CAPS: 300.0000 mg | ORAL_CAPSULE | Freq: Three times a day (TID) | ORAL | 0 refills | Status: AC

## 2023-05-09 NOTE — ED Provider Notes (Signed)
[place] EMERGENCY DEPARTMENT AT [facility] Provider Note   CSN: [id number] Arrival date & time: 05/09/23  1756     History  Chief Complaint  Patient presents with   Animal Bite    Roger York is a 47 y.o. male here presenting with dog bite.  Patient states that he was mowing the yard and neighbors dog came and bit him.  He states that he was bit on the right thumb and also left index finger and right leg.  He states that his right thumb is slightly numb around the wound.  He states that he is up-to-date with tetanus and the dog is up-to-date with his shots.  The history is provided by the patient.       Home Medications Prior to Admission medications   Not on File      Allergies    Penicillins    Review of Systems   Review of Systems  Musculoskeletal:        Right thumb numbness  All other systems reviewed and are negative.   Physical Exam Updated Vital Signs BP (!) 146/93   Pulse 64   Temp 98.3 F (36.8 C) (Oral)   Resp 19   Ht 5\' 10"  (1.778 m)   Wt 79.4 kg   SpO2 100%   BMI 25.11 kg/m  Physical Exam Vitals and nursing note reviewed.  HENT:     Head: Normocephalic.     Nose: Nose normal.     Mouth/Throat:     Mouth: Mucous membranes are moist.  Eyes:     Extraocular Movements: Extraocular movements intact.     Pupils: Pupils are equal, round, and reactive to light.  Cardiovascular:     Rate and Rhythm: Normal rate and regular rhythm.     Pulses: Normal pulses.     Heart sounds: Normal heart sounds.  Pulmonary:     Effort: Pulmonary effort is normal.     Breath sounds: Normal breath sounds.  Abdominal:     General: Abdomen is flat.     Palpations: Abdomen is soft.  Musculoskeletal:     Cervical back: Normal range of motion.     Comments: Right thumb with 2 wounds.  1 wound on the radial aspect is over the IP joint and is about 2 cm.  He has a V shaped wound on the ulnar aspect that is about 3 cm that is well-approximated.   Patient is able to flex and extend the finger.  Patient has slightly decreased sensation over the radial aspect of the finger.  There is no involvement of the nail.  Left index finger with an abrasion.  Patient also has small bite marks on the right leg and neurovascular intact in lower extremities   Skin:    General: Skin is warm.  Neurological:     General: No focal deficit present.     Mental Status: He is alert and oriented to person, place, and time.  Psychiatric:        Mood and Affect: Mood normal.        Behavior: Behavior normal.     ED Results / Procedures / Treatments   Labs (all labs ordered are listed, but only abnormal results are displayed) Labs Reviewed - No data to display  EKG None  Radiology DG Hand Complete Right  Result Date: 05/09/2023 CLINICAL DATA:  Dog bite, right hand pain. EXAM: RIGHT HAND - COMPLETE 3+ VIEW COMPARISON:  None Available. FINDINGS: There  is no evidence of fracture or dislocation. There is no evidence of arthropathy or other focal bone abnormality. Mild soft tissue edema about the thumb. No radiopaque foreign body. IMPRESSION: Mild soft tissue edema. No fracture or radiopaque foreign body. Electronically Signed   By: Narda Rutherford M.D.   On: 05/09/2023 19:22    Procedures Procedures    The wound is cleansed, debrided of foreign material as much as possible, and dressed. The patient is alerted to watch for any signs of infection (redness, pus, pain, increased swelling or fever) and call if such occurs. Home wound care instructions are provided. Tetanus vaccination status reviewed: last tetanus booster within 10 years.   Medications Ordered in ED Medications  clindamycin (CLEOCIN) capsule 300 mg (has no administration in time range)  sulfamethoxazole-trimethoprim (BACTRIM DS) 800-160 MG per tablet 1 tablet (has no administration in time range)    ED Course/ Medical Decision Making/ A&P                                 Medical Decision  Making Roger York is a 47 y.o. male here presenting with dog bite.  Patient has dog bite to right thumb.  Patient has slightly decreased sensation over.  X-ray did not show any fracture or foreign body.  Patient is able to flex and extend the thumb.  I think likely he had nerve injury from the dog bite.  I told him that his sensation may return or it may be permanent.  I was able to use Steri-Strips to approximate the wound and clean and dressed the wound.  Patient has penicillin allergy so will discharge home with clindamycin and Bactrim.   Problems Addressed: Dog bite, initial encounter: acute illness or injury  Amount and/or Complexity of Data Reviewed Radiology: ordered and independent interpretation performed. Decision-making details documented in ED Course.  Risk Prescription drug management.    Final Clinical Impression(s) / ED Diagnoses Final diagnoses:  None    Rx / DC Orders ED Discharge Orders     None         Charlynne Pander, MD 05/09/23 2235

## 2023-05-09 NOTE — ED Provider Triage Note (Signed)
Emergency Medicine Provider Triage Evaluation Note  Roger York , a 47 y.o. male  was evaluated in triage.  Pt complains of dog bite to bilateral hands. Had R thumb numbness. Its a neighbor's dog whose shot is up to date. He is up to date with tdap   Review of Systems  Positive: Dog bites to bilateral hands, R leg  Negative: fever  Physical Exam  BP (!) 146/93   Pulse 64   Temp 98.3 F (36.8 C) (Oral)   Resp 19   Ht 5\' 10"  (1.778 m)   Wt 79.4 kg   SpO2 100%   BMI 25.11 kg/m  Gen:   Awake, no distress   Resp:  Normal effort  MSK:   Two bite marks R thumb, nl capillary refill, able to flex and extend thumb. Abrasions L 2nd finger and R leg  Other:    Medical Decision Making  Medically screening exam initiated at 6:54 PM.  Appropriate orders placed.  Madaline Guthrie was informed that the remainder of the evaluation will be completed by another provider, this initial triage assessment does not replace that evaluation, and the importance of remaining in the ED until their evaluation is complete.  Roger York is a 47 y.o. male here with dog bite to R thumb, L 2nd finger, R leg. Will get xray R thumb      Charlynne Pander, MD 05/09/23 [phone]

## 2023-05-09 NOTE — Discharge Instructions (Signed)
As we discussed, you likely have nerve injury from the dog bite.  The nerve injury may recover on its own.  I recommend you take clindamycin and Bactrim as prescribed for about a week  You can remove the dressing in 2 to 3 days and the Steri-Strips will fall off in about a week  See your doctor for follow-up  Return to ER if you have severe pain, signs of wound infection, fever

## 2023-05-09 NOTE — ED Triage Notes (Signed)
Patients neighbors dog bit him on his right leg and right thumb, left pointer finger. He stated the shots are up to date. Feels like his thumb is numb. Tetanus updated.
